# Patient Record
Sex: Female | Born: 1960
Health system: Southern US, Community
[De-identification: ages and names within clinical notes are randomized; demographics above are authoritative.]

## PROBLEM LIST (undated history)

## (undated) DIAGNOSIS — K5190 Ulcerative colitis, unspecified, without complications: Secondary | ICD-10-CM

## (undated) DIAGNOSIS — E559 Vitamin D deficiency, unspecified: Secondary | ICD-10-CM

## (undated) DIAGNOSIS — E785 Hyperlipidemia, unspecified: Secondary | ICD-10-CM

## (undated) HISTORY — DX: Hyperlipidemia, unspecified: E78.5

## (undated) HISTORY — DX: Vitamin D deficiency, unspecified: E55.9

## (undated) HISTORY — PX: FOOT SURGERY: SHX648

## (undated) HISTORY — PX: TONSILLECTOMY AND ADENOIDECTOMY: SUR1326

## (undated) HISTORY — PX: WISDOM TOOTH EXTRACTION: SHX21

## (undated) HISTORY — DX: Ulcerative colitis, unspecified, without complications: K51.90

---

## 1999-07-26 DIAGNOSIS — K5190 Ulcerative colitis, unspecified, without complications: Secondary | ICD-10-CM

## 1999-07-26 HISTORY — DX: Ulcerative colitis, unspecified, without complications: K51.90

## 1999-07-26 HISTORY — PX: ABDOMINAL HYSTERECTOMY: SHX81

## 2000-09-10 ENCOUNTER — Encounter: Payer: Self-pay | Admitting: Cardiology

## 2005-03-01 ENCOUNTER — Ambulatory Visit: Payer: Self-pay | Admitting: Family Medicine

## 2005-03-17 ENCOUNTER — Ambulatory Visit: Payer: Self-pay | Admitting: Sports Medicine

## 2005-03-25 ENCOUNTER — Ambulatory Visit: Payer: Self-pay | Admitting: Sports Medicine

## 2005-04-14 ENCOUNTER — Ambulatory Visit: Payer: Self-pay | Admitting: Family Medicine

## 2005-05-17 ENCOUNTER — Ambulatory Visit: Payer: Self-pay | Admitting: Psychology

## 2005-08-11 ENCOUNTER — Ambulatory Visit: Payer: Self-pay | Admitting: Psychology

## 2005-08-18 ENCOUNTER — Ambulatory Visit: Payer: Self-pay | Admitting: Psychology

## 2005-09-01 ENCOUNTER — Ambulatory Visit: Payer: Self-pay | Admitting: Family Medicine

## 2005-09-15 ENCOUNTER — Ambulatory Visit: Payer: Self-pay | Admitting: Family Medicine

## 2005-10-06 ENCOUNTER — Ambulatory Visit: Payer: Self-pay | Admitting: Family Medicine

## 2005-11-02 ENCOUNTER — Ambulatory Visit: Payer: Self-pay | Admitting: Psychology

## 2005-11-24 ENCOUNTER — Ambulatory Visit: Payer: Self-pay | Admitting: Psychology

## 2005-12-15 ENCOUNTER — Ambulatory Visit: Payer: Self-pay | Admitting: Psychology

## 2006-01-05 ENCOUNTER — Ambulatory Visit: Payer: Self-pay | Admitting: Psychology

## 2006-02-02 ENCOUNTER — Ambulatory Visit: Payer: Self-pay | Admitting: Psychology

## 2006-03-02 ENCOUNTER — Ambulatory Visit: Payer: Self-pay | Admitting: Family Medicine

## 2006-03-23 ENCOUNTER — Ambulatory Visit: Payer: Self-pay | Admitting: Family Medicine

## 2006-05-09 ENCOUNTER — Ambulatory Visit: Payer: Self-pay | Admitting: Family Medicine

## 2006-06-01 ENCOUNTER — Ambulatory Visit: Payer: Self-pay | Admitting: Psychology

## 2006-06-29 ENCOUNTER — Ambulatory Visit: Payer: Self-pay | Admitting: Family Medicine

## 2006-08-15 ENCOUNTER — Ambulatory Visit: Payer: Self-pay | Admitting: Psychology

## 2007-01-11 ENCOUNTER — Encounter: Admission: RE | Admit: 2007-01-11 | Discharge: 2007-01-11 | Payer: Self-pay | Admitting: Family Medicine

## 2008-01-14 ENCOUNTER — Encounter: Admission: RE | Admit: 2008-01-14 | Discharge: 2008-01-14 | Payer: Self-pay | Admitting: Family Medicine

## 2008-11-20 ENCOUNTER — Ambulatory Visit: Payer: Self-pay | Admitting: Family Medicine

## 2008-11-20 DIAGNOSIS — F4321 Adjustment disorder with depressed mood: Secondary | ICD-10-CM | POA: Insufficient documentation

## 2008-12-01 ENCOUNTER — Ambulatory Visit: Payer: Self-pay | Admitting: Family Medicine

## 2009-01-08 ENCOUNTER — Ambulatory Visit: Payer: Self-pay | Admitting: Psychology

## 2009-01-14 ENCOUNTER — Encounter: Admission: RE | Admit: 2009-01-14 | Discharge: 2009-01-14 | Payer: Self-pay | Admitting: Family Medicine

## 2009-01-22 ENCOUNTER — Ambulatory Visit: Payer: Self-pay | Admitting: Family Medicine

## 2009-02-18 ENCOUNTER — Encounter: Payer: Self-pay | Admitting: Cardiology

## 2009-02-19 ENCOUNTER — Ambulatory Visit: Payer: Self-pay | Admitting: Psychology

## 2009-03-24 ENCOUNTER — Telehealth: Payer: Self-pay | Admitting: Psychology

## 2009-04-06 ENCOUNTER — Ambulatory Visit: Payer: Self-pay | Admitting: Cardiology

## 2009-04-06 DIAGNOSIS — K5190 Ulcerative colitis, unspecified, without complications: Secondary | ICD-10-CM | POA: Insufficient documentation

## 2009-04-06 DIAGNOSIS — I059 Rheumatic mitral valve disease, unspecified: Secondary | ICD-10-CM | POA: Insufficient documentation

## 2009-04-06 DIAGNOSIS — R072 Precordial pain: Secondary | ICD-10-CM | POA: Insufficient documentation

## 2009-04-06 DIAGNOSIS — E7800 Pure hypercholesterolemia, unspecified: Secondary | ICD-10-CM | POA: Insufficient documentation

## 2009-04-09 ENCOUNTER — Ambulatory Visit: Payer: Self-pay | Admitting: Psychology

## 2009-06-04 ENCOUNTER — Ambulatory Visit: Payer: Self-pay | Admitting: Psychology

## 2009-06-11 ENCOUNTER — Ambulatory Visit: Payer: Self-pay | Admitting: Psychology

## 2009-08-04 ENCOUNTER — Ambulatory Visit: Payer: Self-pay | Admitting: Psychology

## 2009-08-14 DIAGNOSIS — F3289 Other specified depressive episodes: Secondary | ICD-10-CM | POA: Insufficient documentation

## 2009-08-14 DIAGNOSIS — N809 Endometriosis, unspecified: Secondary | ICD-10-CM | POA: Insufficient documentation

## 2009-08-14 DIAGNOSIS — E785 Hyperlipidemia, unspecified: Secondary | ICD-10-CM | POA: Insufficient documentation

## 2009-08-14 DIAGNOSIS — F329 Major depressive disorder, single episode, unspecified: Secondary | ICD-10-CM | POA: Insufficient documentation

## 2009-08-17 ENCOUNTER — Ambulatory Visit: Payer: Self-pay | Admitting: Cardiology

## 2009-08-27 ENCOUNTER — Ambulatory Visit: Payer: Self-pay | Admitting: Psychology

## 2009-09-17 ENCOUNTER — Telehealth: Payer: Self-pay | Admitting: Psychology

## 2009-09-18 ENCOUNTER — Ambulatory Visit: Payer: Self-pay

## 2009-09-18 ENCOUNTER — Ambulatory Visit: Payer: Self-pay | Admitting: Cardiology

## 2009-10-05 ENCOUNTER — Ambulatory Visit: Payer: Self-pay | Admitting: Psychology

## 2009-10-12 ENCOUNTER — Ambulatory Visit: Payer: Self-pay | Admitting: Psychology

## 2009-11-05 ENCOUNTER — Ambulatory Visit: Payer: Self-pay | Admitting: Psychology

## 2009-11-23 ENCOUNTER — Ambulatory Visit: Payer: Self-pay | Admitting: Psychology

## 2009-12-31 ENCOUNTER — Ambulatory Visit: Payer: Self-pay | Admitting: Psychology

## 2010-01-29 ENCOUNTER — Ambulatory Visit: Payer: Self-pay | Admitting: Psychology

## 2010-03-18 ENCOUNTER — Encounter: Admission: RE | Admit: 2010-03-18 | Discharge: 2010-03-18 | Payer: Self-pay | Admitting: Family Medicine

## 2010-03-23 ENCOUNTER — Ambulatory Visit (HOSPITAL_BASED_OUTPATIENT_CLINIC_OR_DEPARTMENT_OTHER): Admission: RE | Admit: 2010-03-23 | Discharge: 2010-03-23 | Payer: Self-pay | Admitting: Family Medicine

## 2010-03-23 ENCOUNTER — Ambulatory Visit: Payer: Self-pay | Admitting: Diagnostic Radiology

## 2010-08-15 ENCOUNTER — Encounter: Payer: Self-pay | Admitting: Family Medicine

## 2010-08-24 NOTE — Assessment & Plan Note (Signed)
Summary: Behavioral Medicine Follow-up   Primary Care Provider:  Spero Geralds PsyD   History of Present Illness: Shalonda reports feeling much better.  She left a VM the day after our last meeting and reported a new outlook on the situation.  She reiterated that today.  She reports a better understanding of the dynamics and how they are affecting her.  She continues to be in touch with [name] - nearly daily but does not plan to seem him.  She is not ready to let go of phone contact.   Impression & Recommendations:  Problem # 1:  ADJUSTMENT DISORDER WITH DEPRESSED MOOD (ICD-309.0)  Affect was bright until I challenged her on a number of issues.  She got sad over the course of the session but also appeared very reflective.  She recognizes therapy does not often "feel good" and that this may be part of the process.  Provided additional feedback about her tendency to deny how serious this is to her health and well-being.  Also validated that she needs to operate according to her own time frame.  Problem-solved ways to attempt to stay healthy in the face of this on-going stress.  Follow-up in three weeks or as needed.  Orders: Therapy 40-50- min- [facility] ([id number])  Complete Medication List: 1)  Aleve 220 Mg Tabs (Naproxen sodium) .... As needed

## 2010-08-24 NOTE — Assessment & Plan Note (Signed)
Summary: Behavioral Medicine Follow-up   Primary Care Provider:  Spero Geralds PsyD   History of Present Illness: Tamara Richardson is feeling sad and angry about the relationship with [name].  She had fallen back into it again only to be reminded of the reality of the situaiton.  Discussed this at length.   Impression & Recommendations:  Problem # 1:  ADJUSTMENT DISORDER WITH DEPRESSED MOOD (ICD-309.0) Affect is sad.  Report of  mood is same.  She is tearful.  Highlighted ambivalence while also framing the sickness (pathology) of this man and how it has made her so unwell.  This seemed to resonate a little more.  This isn't just about a bad match - this is about pathology that was his but has become hers.  She still wants to "save" him but seems to be recognizing more that this is her pathology.    Used importance and confidence scales.  Alarming that she believes it is important to get him out of her life for the short term but not necessarily the long-term.  Reflected back how hard it is to let go.  Confidence is not too low but she has been unsuccessful in the past.  Talked about what would need to happen to be successful.   She agreed to meet sooner than our typical so we scheduled for:  Monday 3/21st at 4:00.   Orders: Therapy 40-50- min- [facility] ([id number])  Complete Medication List: 1)  Aleve 220 Mg Tabs (Naproxen sodium) .... As needed
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
## 2010-08-24 NOTE — Assessment & Plan Note (Signed)
Summary: rov/chest pain/jml   Primary Provider:  Spero Geralds PsyD   History of Present Illness: 50 yo female  who I saw in September of 2010 for chest pain. The patient has had intermittent chest pain since age 59 by her report. It is in the left upper chest with occasional radiation to the left breast. There is no associated nausea, vomiting, shortness of breath or diaphoresis. It is not pleuritic or positional no work is it related to food. It is not exertional.  Her last episode occurred in early January. She feels these may be stress related. Note she did not return for her previously ordered stress echocardiogram. Note she denies any dyspnea on exertion, orthopnea, PND, pedal edema, palpitations or syncope.  Current Medications (verified): 1)  Aleve 220 Mg Tabs (Naproxen Sodium) .... As Needed  Past History:  Past Medical History: Current Problems:  MITRAL VALVE PROLAPSE (ICD-424.0) HYPERCHOLESTEROLEMIA-PURE (ICD-272.0) COLITIS, ULCERATIVE (ICD-556.9) ADJUSTMENT DISORDER WITH DEPRESSED MOOD (ICD-309.0) ENDOMETRIOSIS (ICD-617.9) DEPRESSION (ICD-311) Fibroids Skin disorder Exercise induced asthma  Past Surgical History: Reviewed history from 04/06/2009 and no changes required. TAH and BSO Tonsillectomy D and C  Social History: Reviewed history from 04/06/2009 and no changes required. Full Time Married  Tobacco Use - No.  Alcohol Use - yes  Review of Systems       no fevers or chills, productive cough, hemoptysis, dysphasia, odynophagia, melena, hematochezia, dysuria, hematuria, rash, seizure activity, orthopnea, PND, pedal edema, claudication. Remaining systems are negative.   Vital Signs:  Patient profile:   50 year old female Height:      68 inches Weight:      190 pounds BMI:     28.99 Pulse rate:   84 / minute Resp:     12 per minute BP sitting:   106 / 74  (left arm)  Vitals Entered By: Kem Parkinson (August 17, 2009 4:06 PM)  Physical  Exam  General:  Well-developed well-nourished in no acute distress.  Skin is warm and dry.  HEENT is normal.  Neck is supple. No thyromegaly.  Chest is clear to auscultation with normal expansion.  Cardiovascular exam is regular rate and rhythm.  Abdominal exam nontender or distended. No masses palpated. Extremities show no edema. neuro grossly intact    EKG  Procedure date:  08/17/2009  Findings:      Sinus rhythm at a rate of 81. Axis normal. No ST changes.  Impression & Recommendations:  Problem # 1:  CHEST PAIN, PRECORDIAL (ICD-786.51) Symptoms atypical. Will schedule stress echocardiogram for risk stratification.  Problem # 2:  HYPERLIPIDEMIA (ICD-272.4) Continue statin. Lipids and liver monitored by primary care. The following medications were removed from the medication list:    Simvastatin 10 Mg Tabs (Simvastatin) .Marland Kitchen... Take one tablet by mouth daily at bedtime  Problem # 3:  COLITIS, ULCERATIVE (ICD-556.9)  Problem # 4:  MITRAL VALVE PROLAPSE (ICD-424.0)  Orders: Stress Echo (Stress Echo)  Problem # 5:  ADJUSTMENT DISORDER WITH DEPRESSED MOOD (ICD-309.0)  Patient Instructions: 1)  Your physician recommends that you schedule a follow-up appointment in: as needed pending test results 2)  Your physician has requested that you have a stress echocardiogram. For further information please visit [email or web address]  Please follow instruction sheet as given.
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
## 2010-08-24 NOTE — Assessment & Plan Note (Signed)
Summary: Behavioral Medicine Follow-up   Primary Care Provider:  Spero Geralds PsyD   History of Present Illness: Doing well overall.  Great cruise with her sister.  Trouble with [name].  Continues to extract from unhealthy relationship.  Looking to pursue a passion but doesn't know what.   Impression & Recommendations:  Problem # 1:  ADJUSTMENT DISORDER WITH DEPRESSED MOOD (ICD-309.0)  Affect wnl.  Mood is reported as euthymic.  Thoughts are clear and goal directed.  Less [name] talk today.  I hope this is a good sign.  Will follow in four weeks per her request.    Orders: Therapy 40-50- min- [facility] ([id number])  Complete Medication List: 1)  Aleve 220 Mg Tabs (Naproxen sodium) .... As needed

## 2010-08-24 NOTE — Assessment & Plan Note (Signed)
Summary: Behavioral Medicine Follow-up   Primary Care Provider:  Spero Geralds PsyD   History of Present Illness: Tamara Richardson reports that she continues to struggle.  "Nothing is easy."  Spent time talking about [name] (she is getting closer to cutting off contact), [name] and [name].  Things are going well with [facility].  She has some significant concerns about [name].   Impression & Recommendations:  Problem # 1:  ADJUSTMENT DISORDER WITH DEPRESSED MOOD (ICD-309.0)  Report of mood is low normal.  Affect is about the same.  Thoughts seem more negative than her baseline.  She has some loss of pleasure in normally pleasant activities.  Work and home function remain wnl.  She did get some chest pain again after not having any for five months.  She got it after a long conversation with [name].  She ties it to her anxiety / heart "ache."  She has had several cardiac work-ups.  Discussed options.  She feels stuck.  She is not interested in medication.  She cites the things that help keep her balanced are regular physical activity and seeing her son successful and happy.  Will pursue the idea of "happiness" more next visit.  She elected to schedule for one month.  Orders: Therapy 40-50- min- [facility] ([id number])  Complete Medication List: 1)  Aleve 220 Mg Tabs (Naproxen sodium) .... As needed

## 2010-08-24 NOTE — Letter (Signed)
Summary: [facility]  [facility] Office Note   Imported By: Roderic Ovens 08/21/2009 10:34:42  _____________________________________________________________________  External Attachment:    Type:   Image     Comment:   External Document

## 2010-08-24 NOTE — Progress Notes (Signed)
Summary: Cancel and reschedule beh med  Phone Note Call from Patient   Caller: Patient Call For: Spero Geralds PsyD Summary of Call: Derisha called to cancel her March 3rd at 3:00 appt.  We rescheduled for March 14th at 9:00 a.m. Initial call taken by: Spero Geralds PsyD,  September 17, 2009 2:09 PM

## 2010-08-24 NOTE — Assessment & Plan Note (Signed)
Summary: Behavioral Medicine   Primary Care Provider:  Spero Geralds PsyD   History of Present Illness: Continues half-in / half-out of relationship with [name].  Is working on moving away from it.  Getting great support from family and friends.  Cruise with sister at the end of this week.  Work function remains fine.  She is variable in her mood based on the situation with [name].  She feels more powerful.   Impression & Recommendations:  Problem # 1:  ADJUSTMENT DISORDER WITH DEPRESSED MOOD (ICD-309.0) Laid off any challenging of beliefs.  These are the same as sessions past.  She sees movement and I rolled with this.  She still is assigning him responsibility for ending the relationship.  I did challenge this.  It is very unhealthy and potentially unsafe.    Thoughts today are clear and goal-directed.  Change is hard.  Report of mood is within normal limits.  Affect is wnl.  No evidence of SI / HI.  Elected to follow in three weeks.    Orders: Therapy 40-50- min- [facility] ([id number])  Complete Medication List: 1)  Simvastatin 10 Mg Tabs (Simvastatin) .... Take one tablet by mouth daily at bedtime 2)  Aleve 220 Mg Tabs (Naproxen sodium) .... As needed

## 2010-09-01 ENCOUNTER — Encounter: Payer: Self-pay | Admitting: *Deleted

## 2010-10-14 ENCOUNTER — Ambulatory Visit (INDEPENDENT_AMBULATORY_CARE_PROVIDER_SITE_OTHER): Payer: Managed Care, Other (non HMO) | Admitting: Psychology

## 2010-10-14 DIAGNOSIS — F4321 Adjustment disorder with depressed mood: Secondary | ICD-10-CM

## 2010-10-14 NOTE — Assessment & Plan Note (Signed)
Positive symptoms of depression include more depressed mood, anhedonia, middle insomnia (2-3 am nearly every night), some decreased appetite and mild feelings of not being valued.  Still has energy.  No evidence of SI.  Work productivity has been very good and she is valued there (although this doesn't really count for her).  She wonders about medication.  Discussed the advantages and disadvantages.  She thinks she will wait to see if she remains depressed after April 3rd.  She typically starts feeling better after her mother's birthday.  Discussed the relationship with [facility].  I wonder if he is struggling with anger about his Dad.  Tamara Richardson doesn't think so.  Recommended a book to help manage her feelings and maybe do something helpful regarding Devon.  Will follow in one week to check progress.  Might focus on identifying pleasant activities on a weekly basis and not thinking catastrophically about her current mood issues.

## 2010-10-14 NOTE — Progress Notes (Signed)
Tamara Richardson reports she is having a tough time.  She wonders about seasonal effects on her mood and also notes that she grows sad because her Mom died in early 2022-10-09 and her Mom's birthday is April 3rd.  In addition to this, she has been having an especially hard time with [place].  Continues with support from girlfriends although missing the shared responsibility that a significant other might bring.  Feels alone in the world.

## 2010-10-22 ENCOUNTER — Ambulatory Visit (INDEPENDENT_AMBULATORY_CARE_PROVIDER_SITE_OTHER): Payer: Managed Care, Other (non HMO) | Admitting: Psychology

## 2010-10-22 DIAGNOSIS — F4321 Adjustment disorder with depressed mood: Secondary | ICD-10-CM

## 2010-10-22 NOTE — Assessment & Plan Note (Signed)
Report of mood is down.  Affect is consistent.  Function remains high (her list of things accomplished yesterday was notable).  She has energy and motivation to get things done.  Her sleep is poor and this is a long standing problem.  With regards to [place], she has been parenting him without support from [employer] for some time.  She recognizes her current approach is not helping but is not sure what she could / should change.  She does "throw [name] under the bus" at times.  Also tends to try to motivate [place] in ways that are probably not useful.  Discussed.  Reiterated the book recommendation which I think will be helpful.  She says [name] would not be interested in therapy.  Other intervention - gratitude diary for one week to see if this has an effect on mood.  Will follow after Easter.  She is at the beach until the day before the appt with [name] and good friends.

## 2010-10-22 NOTE — Progress Notes (Signed)
Tamara Richardson continues to struggle with feeling badly about her life.  She thinks that no part of it is going well. Despite her success (and respect) at work, it is stressful and people don't listen to her when she asks for help.  She identifies the situation with [facility] as being the toughest thing to manage.  We spent the bulk of the time talking about that.  She is not interesting in taking a medication at present.

## 2010-11-04 ENCOUNTER — Ambulatory Visit (INDEPENDENT_AMBULATORY_CARE_PROVIDER_SITE_OTHER): Payer: Managed Care, Other (non HMO) | Admitting: Psychology

## 2010-11-04 DIAGNOSIS — F3289 Other specified depressive episodes: Secondary | ICD-10-CM

## 2010-11-04 DIAGNOSIS — F329 Major depressive disorder, single episode, unspecified: Secondary | ICD-10-CM

## 2010-11-04 NOTE — Patient Instructions (Signed)
Please schedule an appt for April 19th at 11:00. Nicole Cella, Ph.D. At [facility] Not forgiving someone is like drinking rat poison and expecting the rat to die. Feelings are not facts.  Keep that in mind as you wrestle with how you are feeling.

## 2010-11-05 NOTE — Progress Notes (Signed)
Oddie reports things have gotten worse - not better.  She had an especially bad time on vacation where things deteriorated further with [place].  In addition, she felt used by her friends and not well supported.  She does not remember a time where no aspect of her life seemed to be in a decent place.  She has frequently been buoyed by at least one positive thing.  She has a great deal of anger and resentment with [name] that can be directed toward [place].  In addition, she has good reason to be angry with [facility] as well.  Of course, feeling hurt is the biggest issue.

## 2010-11-05 NOTE — Assessment & Plan Note (Signed)
Report of mood is depressed.  Affect is consistent.  This is the most sad I have seen her.  Symptoms have likely gotten to the level where depression is a more accurate diagnosis than an "adjustment issue."  Her thinking is very negative and creative problem solving is not possible.  She descirbed herself as a "hopeless case."  Discuss this and its potential relationship to self-harm.  She denies.  No personal or family history.  Major barrier is her son.  Biggest issue remains with [name] who is proving increasingly challenging.  She does not have support from anyone around parenting.  Endeavored to shift the focus onto her while being careful of not suggesting blame.  The message:  Attempts to control Devon have not gone well.  She may feel better to focus on herself, not attempt to change his behavior (for a while) and regain a sense of balance.  Having said that - therapy for the both of them is an important step.  She dreads the battle and she might not have the energy to frame it well right now.  Scheduled back in one week.   See patient instructions for further interventions.

## 2010-11-11 ENCOUNTER — Ambulatory Visit (INDEPENDENT_AMBULATORY_CARE_PROVIDER_SITE_OTHER): Payer: Managed Care, Other (non HMO) | Admitting: Psychology

## 2010-11-11 DIAGNOSIS — F3289 Other specified depressive episodes: Secondary | ICD-10-CM

## 2010-11-11 DIAGNOSIS — F329 Major depressive disorder, single episode, unspecified: Secondary | ICD-10-CM

## 2010-11-11 NOTE — Assessment & Plan Note (Signed)
Report of mood is "better."  She is less down and less reactive.  Her affect today is brighter and her thoughts are more positive.  Her insight seems improved with regards to why she behaves with [name] the way she does.  Specifically talked about [name] and how to manage this with [facility].  Also talked about changing course when heading toward battle and using "I wish..." as a way to move around challenging situations she can't control.  Will follow on May 3 at 11:00.

## 2010-11-11 NOTE — Progress Notes (Signed)
Tamara Richardson reported that she is in a better place today.  After our last meeting, she started to feel a bit calmer and was able to take a more neutral stance with [place].  She notes continued conflict with some "blow-ups" but overall, she is feeling better.  She has a therapy appt scheduled this afternoon with [place].  He does not know because he reacted so negatively when she asked him about it.  Discussed this.  Also discussed specific strategies to employ that might be helpful.

## 2010-11-25 ENCOUNTER — Ambulatory Visit (INDEPENDENT_AMBULATORY_CARE_PROVIDER_SITE_OTHER): Payer: Managed Care, Other (non HMO) | Admitting: Psychology

## 2010-11-25 DIAGNOSIS — F3289 Other specified depressive episodes: Secondary | ICD-10-CM

## 2010-11-25 DIAGNOSIS — F329 Major depressive disorder, single episode, unspecified: Secondary | ICD-10-CM

## 2010-11-25 NOTE — Progress Notes (Signed)
Kaoru reports she is continues to do better than a few appts ago but is still having a tough time.  She has been to three therapy sessions with her son and that is going (reasonably) well.  Through some reading she seems to be gaining some insights about her style of parenting.  Discussed briefly her relationship with [name] and then focused on [place].  She has a great deal of anger with [name] and it is often transferred to [place].  Looked at ways to manage.  With regards to [name], the relationship was rekindled a few times.  Cali knows it is an unhealthy relationship and has some ideas about what keeps her hooked in.  She says she is closer to being done than she has been in a while.
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
## 2010-12-09 ENCOUNTER — Ambulatory Visit (INDEPENDENT_AMBULATORY_CARE_PROVIDER_SITE_OTHER): Payer: Managed Care, Other (non HMO) | Admitting: Psychology

## 2010-12-09 DIAGNOSIS — F3289 Other specified depressive episodes: Secondary | ICD-10-CM

## 2010-12-09 DIAGNOSIS — F329 Major depressive disorder, single episode, unspecified: Secondary | ICD-10-CM

## 2010-12-09 NOTE — Assessment & Plan Note (Signed)
Symptoms have largely remitted.  Giving the timing - it makes me wonder about the spring connection with Mother's Day, her mother's death etc..Marland KitchenWill be mindful of this for the future.  Work - life balance is poor right now and will likely take a toll eventually.  Discussed the idea that she is running close to empty and has no real means to fill up [facility] no longer does that for her and neither does work).  She named a couple of things that might be helpful including the trips she has planned and the time away from [place].  In addition, she is hopeful that things will shift when she closes this deal at work.  Until then, she plans to "hold on" and reduce her expectations regarding her life satisfaction.  Agreed to meet again in about a month in an attempt to provide support.  She can call in between.

## 2010-12-09 NOTE — Progress Notes (Signed)
Tamara Richardson reports that she thinks her depression has lifted.  She continues with significant stress at work and at home.  She has worked late almost every night (until 8:30) and is getting burned out with this.  Attempts to get help at work have not been fruitful.  She continues in therapy with her son.  The situation is not much better and she is drained by him.  Dr. Ave Filter has made the suggestion to not "fix" or "rescue" him when he fails to follow through.  This is a really tough sell for [employer]  Tamara Richardson has a busy summer with a trip to [place] and a few trips to the beach planned.  Tamara Richardson will be gone for two weeks at various camps as well.

## 2011-01-17 ENCOUNTER — Ambulatory Visit (INDEPENDENT_AMBULATORY_CARE_PROVIDER_SITE_OTHER): Payer: Managed Care, Other (non HMO) | Admitting: Psychology

## 2011-01-17 DIAGNOSIS — F329 Major depressive disorder, single episode, unspecified: Secondary | ICD-10-CM

## 2011-01-17 NOTE — Assessment & Plan Note (Addendum)
Report of mood is within normal limits but low for her.  Affect is within normal limits.  She looks well.  Thoughts are clear and goal directed.  Hit the issue with [name] pretty hard - attempting to understand what drives her to stay engaged with someone that is so unhealthy for her.  Discussed the beauty of behavior change - that doing it differently even at this point can be helpful.  Cognitively she understands however the behavior has always been thr tricky part with her.  Reminded her that just because she feels a certain way (bad mom) doesn't make it so.  Reviewed what has worked well and what typically doesn't.  Empathized with the single parent phenomenon - no one there to offer feedback or step in when her approach is less than helpful.    Overall, her mood is low end of normal.  She remains very functional but her cognitive and affective outlook is not as good as she would like.  She is still recovering from being significantly overworked.  Will monitor.

## 2011-01-17 NOTE — Progress Notes (Signed)
Tamara Richardson reports continued difficulty on all fronts although her work situation has calmed down.  Upon reflection - she has billed more hours the last five months than she did practically the whole year last year.  Understanding how out of balance her work / home life has been has shed some light on her feeling drained.  She mentioned that Tamara Richardson lost his job and she got "sucked back in."  Discussed this at length.  Reviewed the parenting situation with [place].  She feels like a bad mom and that she is no longer Tamara Richardson's #1 - or anyone's for that matter.  Had a "pitty party" on Saturday and felt better on Sunday.  Feels good about this (that she allowed herself to do this and rebounded after).

## 2011-02-03 ENCOUNTER — Ambulatory Visit (INDEPENDENT_AMBULATORY_CARE_PROVIDER_SITE_OTHER): Payer: Managed Care, Other (non HMO) | Admitting: Psychology

## 2011-02-03 DIAGNOSIS — F329 Major depressive disorder, single episode, unspecified: Secondary | ICD-10-CM

## 2011-02-04 NOTE — Progress Notes (Signed)
Tamara Richardson reports she got a major bonus from her work and a [employer], [employer] for her hard work.  This felt good.  Vacation to [place] went reasonable well.  [name] has been at away camps and [name] is enjoying the time to herself.  She has some other vacation scheduled.  She continues contact with [name].  Sees some of herself in him.  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

## 2011-02-04 NOTE — Assessment & Plan Note (Signed)
Report of mood is better.  Affect is consistent.  This is the best I have seen her in a while.  She has been leaving work around 3:00 several days and the positive feedback from her bosses seems to have validated her nicely.  The break from [place] likely eases the general sense of stress.  She says him being out of school means less arguments over that type of thing.  Looked at not using threats or ultimatums to motivate him as they are not likely to be effective.  Will follow as scheduled.

## 2011-03-08 ENCOUNTER — Other Ambulatory Visit: Payer: Self-pay | Admitting: Family Medicine

## 2011-03-08 DIAGNOSIS — Z1231 Encounter for screening mammogram for malignant neoplasm of breast: Secondary | ICD-10-CM

## 2011-03-24 ENCOUNTER — Ambulatory Visit (INDEPENDENT_AMBULATORY_CARE_PROVIDER_SITE_OTHER): Payer: Managed Care, Other (non HMO) | Admitting: Psychology

## 2011-03-24 DIAGNOSIS — F4321 Adjustment disorder with depressed mood: Secondary | ICD-10-CM

## 2011-03-24 NOTE — Assessment & Plan Note (Signed)
Mood issues that brought her in this time seem largely to have resolved.  Getting re-involved with [name] is a curious decision.  In her current state, she seems to be able to manage the situation well.  She recognizes that she still has that "spark" that could ignite a more intense relationship with him and that such a relationship is not a great idea.  She is positive in her thought content.  She looks well.  Function is good.  Outlook is good.  Discussed follow-up.  She will call as needed.
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
## 2011-03-25 ENCOUNTER — Ambulatory Visit
Admission: RE | Admit: 2011-03-25 | Discharge: 2011-03-25 | Disposition: A | Discharge status: Home / Self Care | Payer: Managed Care, Other (non HMO) | Source: Ambulatory Visit | Attending: Family Medicine | Admitting: Family Medicine

## 2011-03-25 DIAGNOSIS — Z1231 Encounter for screening mammogram for malignant neoplasm of breast: Secondary | ICD-10-CM

## 2012-01-12 ENCOUNTER — Ambulatory Visit (INDEPENDENT_AMBULATORY_CARE_PROVIDER_SITE_OTHER): Payer: Managed Care, Other (non HMO) | Admitting: Psychology

## 2012-01-12 DIAGNOSIS — F4321 Adjustment disorder with depressed mood: Secondary | ICD-10-CM
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
## 2012-04-20 ENCOUNTER — Other Ambulatory Visit: Payer: Self-pay | Admitting: Family Medicine

## 2012-04-20 DIAGNOSIS — Z1231 Encounter for screening mammogram for malignant neoplasm of breast: Secondary | ICD-10-CM

## 2012-05-07 ENCOUNTER — Ambulatory Visit
Admission: RE | Admit: 2012-05-07 | Discharge: 2012-05-07 | Disposition: A | Discharge status: Home / Self Care | Payer: Managed Care, Other (non HMO) | Source: Ambulatory Visit | Attending: Family Medicine | Admitting: Family Medicine

## 2012-05-07 DIAGNOSIS — Z1231 Encounter for screening mammogram for malignant neoplasm of breast: Secondary | ICD-10-CM

## 2013-03-05 ENCOUNTER — Other Ambulatory Visit: Payer: Self-pay | Admitting: Family Medicine

## 2013-03-05 DIAGNOSIS — R110 Nausea: Secondary | ICD-10-CM

## 2013-03-05 DIAGNOSIS — R109 Unspecified abdominal pain: Secondary | ICD-10-CM

## 2013-03-06 ENCOUNTER — Ambulatory Visit
Admission: RE | Admit: 2013-03-06 | Discharge: 2013-03-06 | Disposition: A | Discharge status: Home / Self Care | Payer: Managed Care, Other (non HMO) | Source: Ambulatory Visit | Attending: Family Medicine | Admitting: Family Medicine

## 2013-03-06 DIAGNOSIS — R109 Unspecified abdominal pain: Secondary | ICD-10-CM

## 2013-03-06 DIAGNOSIS — R110 Nausea: Secondary | ICD-10-CM

## 2013-04-26 ENCOUNTER — Other Ambulatory Visit: Payer: Self-pay

## 2013-04-26 DIAGNOSIS — Z1231 Encounter for screening mammogram for malignant neoplasm of breast: Secondary | ICD-10-CM

## 2013-05-08 ENCOUNTER — Ambulatory Visit: Payer: Managed Care, Other (non HMO)

## 2013-05-22 ENCOUNTER — Ambulatory Visit
Admission: RE | Admit: 2013-05-22 | Discharge: 2013-05-22 | Disposition: A | Discharge status: Home / Self Care | Payer: Private Health Insurance - Indemnity | Source: Ambulatory Visit

## 2013-05-22 DIAGNOSIS — Z1231 Encounter for screening mammogram for malignant neoplasm of breast: Secondary | ICD-10-CM

## 2013-05-24 ENCOUNTER — Ambulatory Visit: Payer: Self-pay

## 2014-05-12 ENCOUNTER — Other Ambulatory Visit: Payer: Self-pay

## 2014-05-12 DIAGNOSIS — Z1239 Encounter for other screening for malignant neoplasm of breast: Secondary | ICD-10-CM

## 2014-05-15 ENCOUNTER — Other Ambulatory Visit: Payer: Self-pay

## 2014-05-15 DIAGNOSIS — Z1231 Encounter for screening mammogram for malignant neoplasm of breast: Secondary | ICD-10-CM

## 2014-05-23 ENCOUNTER — Ambulatory Visit: Payer: Private Health Insurance - Indemnity

## 2014-06-09 DIAGNOSIS — E785 Hyperlipidemia, unspecified: Secondary | ICD-10-CM

## 2014-06-09 HISTORY — DX: Hyperlipidemia, unspecified: E78.5

## 2014-06-10 ENCOUNTER — Ambulatory Visit
Admission: RE | Admit: 2014-06-10 | Discharge: 2014-06-10 | Disposition: A | Discharge status: Home / Self Care | Payer: BC Managed Care – PPO | Source: Ambulatory Visit

## 2014-06-10 ENCOUNTER — Encounter (INDEPENDENT_AMBULATORY_CARE_PROVIDER_SITE_OTHER): Payer: Self-pay

## 2014-06-10 DIAGNOSIS — Z1231 Encounter for screening mammogram for malignant neoplasm of breast: Secondary | ICD-10-CM

## 2015-05-22 ENCOUNTER — Other Ambulatory Visit: Payer: Self-pay

## 2015-05-22 DIAGNOSIS — Z1231 Encounter for screening mammogram for malignant neoplasm of breast: Secondary | ICD-10-CM

## 2015-06-12 ENCOUNTER — Ambulatory Visit: Payer: Self-pay

## 2015-06-15 ENCOUNTER — Ambulatory Visit
Admission: RE | Admit: 2015-06-15 | Discharge: 2015-06-15 | Disposition: A | Discharge status: Home / Self Care | Payer: BLUE CROSS/BLUE SHIELD | Source: Ambulatory Visit

## 2015-06-15 DIAGNOSIS — Z1231 Encounter for screening mammogram for malignant neoplasm of breast: Secondary | ICD-10-CM

## 2016-06-03 ENCOUNTER — Other Ambulatory Visit: Payer: Self-pay | Admitting: Family Medicine

## 2016-06-03 DIAGNOSIS — Z1231 Encounter for screening mammogram for malignant neoplasm of breast: Secondary | ICD-10-CM

## 2016-06-15 ENCOUNTER — Ambulatory Visit: Payer: BLUE CROSS/BLUE SHIELD

## 2016-06-21 ENCOUNTER — Ambulatory Visit
Admission: RE | Admit: 2016-06-21 | Discharge: 2016-06-21 | Disposition: A | Discharge status: Home / Self Care | Payer: BLUE CROSS/BLUE SHIELD | Source: Ambulatory Visit | Attending: Family Medicine | Admitting: Family Medicine

## 2016-06-21 DIAGNOSIS — Z1231 Encounter for screening mammogram for malignant neoplasm of breast: Secondary | ICD-10-CM

## 2016-12-03 DIAGNOSIS — L258 Unspecified contact dermatitis due to other agents: Secondary | ICD-10-CM | POA: Diagnosis not present

## 2016-12-03 DIAGNOSIS — T7840XA Allergy, unspecified, initial encounter: Secondary | ICD-10-CM | POA: Diagnosis not present

## 2017-02-10 DIAGNOSIS — Z713 Dietary counseling and surveillance: Secondary | ICD-10-CM | POA: Diagnosis not present

## 2017-02-23 DIAGNOSIS — D2230 Melanocytic nevi of unspecified part of face: Secondary | ICD-10-CM | POA: Diagnosis not present

## 2017-02-23 DIAGNOSIS — L821 Other seborrheic keratosis: Secondary | ICD-10-CM | POA: Diagnosis not present

## 2017-02-28 DIAGNOSIS — Z23 Encounter for immunization: Secondary | ICD-10-CM | POA: Diagnosis not present

## 2017-02-28 DIAGNOSIS — Z0000 Encounter for general adult medical examination without abnormal findings: Secondary | ICD-10-CM | POA: Diagnosis not present

## 2017-04-19 DIAGNOSIS — Z713 Dietary counseling and surveillance: Secondary | ICD-10-CM | POA: Diagnosis not present

## 2017-04-21 DIAGNOSIS — Z23 Encounter for immunization: Secondary | ICD-10-CM | POA: Diagnosis not present

## 2017-05-03 DIAGNOSIS — Z713 Dietary counseling and surveillance: Secondary | ICD-10-CM | POA: Diagnosis not present

## 2017-05-17 DIAGNOSIS — Z713 Dietary counseling and surveillance: Secondary | ICD-10-CM | POA: Diagnosis not present

## 2017-05-22 ENCOUNTER — Other Ambulatory Visit: Payer: Self-pay | Admitting: Family Medicine

## 2017-05-22 DIAGNOSIS — Z1231 Encounter for screening mammogram for malignant neoplasm of breast: Secondary | ICD-10-CM

## 2017-05-31 DIAGNOSIS — Z713 Dietary counseling and surveillance: Secondary | ICD-10-CM | POA: Diagnosis not present

## 2017-06-07 DIAGNOSIS — Z713 Dietary counseling and surveillance: Secondary | ICD-10-CM | POA: Diagnosis not present

## 2017-06-28 ENCOUNTER — Ambulatory Visit
Admission: RE | Admit: 2017-06-28 | Discharge: 2017-06-28 | Disposition: A | Discharge status: Home / Self Care | Payer: BLUE CROSS/BLUE SHIELD | Source: Ambulatory Visit | Attending: Family Medicine | Admitting: Family Medicine

## 2017-06-28 DIAGNOSIS — Z1231 Encounter for screening mammogram for malignant neoplasm of breast: Secondary | ICD-10-CM

## 2017-10-16 DIAGNOSIS — L570 Actinic keratosis: Secondary | ICD-10-CM | POA: Diagnosis not present

## 2017-10-16 DIAGNOSIS — D1801 Hemangioma of skin and subcutaneous tissue: Secondary | ICD-10-CM | POA: Diagnosis not present

## 2017-10-16 DIAGNOSIS — L821 Other seborrheic keratosis: Secondary | ICD-10-CM | POA: Diagnosis not present

## 2017-10-16 DIAGNOSIS — L814 Other melanin hyperpigmentation: Secondary | ICD-10-CM | POA: Diagnosis not present

## 2017-10-16 DIAGNOSIS — D225 Melanocytic nevi of trunk: Secondary | ICD-10-CM | POA: Diagnosis not present

## 2018-04-09 ENCOUNTER — Encounter: Payer: Self-pay | Admitting: Physician Assistant

## 2018-04-09 ENCOUNTER — Ambulatory Visit (INDEPENDENT_AMBULATORY_CARE_PROVIDER_SITE_OTHER): Payer: BLUE CROSS/BLUE SHIELD | Admitting: Physician Assistant

## 2018-04-09 VITALS — BP 120/84 | HR 84 | Temp 98.4°F | Ht 67.5 in | Wt 204.0 lb

## 2018-04-09 DIAGNOSIS — Z7689 Persons encountering health services in other specified circumstances: Secondary | ICD-10-CM | POA: Diagnosis not present

## 2018-04-09 DIAGNOSIS — R42 Dizziness and giddiness: Secondary | ICD-10-CM | POA: Diagnosis not present

## 2018-04-09 DIAGNOSIS — G44209 Tension-type headache, unspecified, not intractable: Secondary | ICD-10-CM

## 2018-04-09 MED ORDER — BUTALBITAL-APAP-CAFFEINE 50-325-40 MG PO TABS: 1.0000 | ORAL_TABLET | Freq: Four times a day (QID) | ORAL | 0 refills | Status: AC | PRN

## 2018-04-09 NOTE — Patient Instructions (Addendum)
It was great to see you!  Let's follow-up in 1 month, sooner if you have concerns.  Take care,  Inda Coke PA-C   Tension Headache A tension headache is a feeling of pain, pressure, or aching that is often felt over the front and sides of the head. The pain can be dull, or it can feel tight (constricting). Tension headaches are not normally associated with nausea or vomiting, and they do not get worse with physical activity. Tension headaches can last from 30 minutes to several days. This is the most common type of headache. CAUSES The exact cause of this condition is not known. Tension headaches often begin after stress, anxiety, or depression. Other triggers may include:  Alcohol.  Too much caffeine, or caffeine withdrawal.  Respiratory infections, such as colds, flu, or sinus infections.  Dental problems or teeth clenching.  Fatigue.  Holding your head and neck in the same position for a long period of time, such as while using a computer.  Smoking. SYMPTOMS Symptoms of this condition include:  A feeling of pressure around the head.  Dull, aching head pain.  Pain felt over the front and sides of the head.  Tenderness in the muscles of the head, neck, and shoulders. DIAGNOSIS This condition may be diagnosed based on your symptoms and a physical exam. Tests may be done, such as a CT scan or an MRI of your head. These tests may be done if your symptoms are severe or unusual. TREATMENT This condition may be treated with lifestyle changes and medicines to help relieve symptoms. HOME CARE INSTRUCTIONS Managing Pain  Take over-the-counter and prescription medicines only as told by your health care provider.  Lie down in a dark, quiet room when you have a headache.  If directed, apply ice to the head and neck area: ? Put ice in a plastic bag. ? Place a towel between your skin and the bag. ? Leave the ice on for 20 minutes, 2-3 times per day.  Use a heating pad or  a hot shower to apply heat to the head and neck area as told by your health care provider. Eating and Drinking  Eat meals on a regular schedule.  Limit alcohol use.  Decrease your caffeine intake, or stop using caffeine. General Instructions  Keep all follow-up visits as told by your health care provider. This is important.  Keep a headache journal to help find out what may trigger your headaches. For example, write down: ? What you eat and drink. ? How much sleep you get. ? Any change to your diet or medicines.  Try massage or other relaxation techniques.  Limit stress.  Sit up straight, and avoid tensing your muscles.  Do not use tobacco products, including cigarettes, chewing tobacco, or e-cigarettes. If you need help quitting, ask your health care provider.  Exercise regularly as told by your health care provider.  Get 7-9 hours of sleep, or the amount recommended by your health care provider. SEEK MEDICAL CARE IF:  Your symptoms are not helped by medicine.  You have a headache that is different from what you normally experience.  You have nausea or you vomit.  You have a fever. SEEK IMMEDIATE MEDICAL CARE IF:  Your headache becomes severe.  You have repeated vomiting.  You have a stiff neck.  You have a loss of vision.  You have problems with speech.  You have pain in your eye or ear.  You have muscular weakness or loss of  muscle control.  You lose your balance or you have trouble walking.  You feel faint or you pass out.  You have confusion. This information is not intended to replace advice given to you by your health care provider. Make sure you discuss any questions you have with your health care provider. Document Released: 07/11/2005 Document Revised: 04/01/2015 Document Reviewed: 11/03/2014 Elsevier Interactive Patient Education  2017 [employer].

## 2018-04-09 NOTE — Progress Notes (Signed)
Tamara Richardson is a 57 y.o. female here to Establish Care.  I acted as a Education administrator for [employer], PA-C Anselmo Pickler, LPN  History of Present Illness:   Chief Complaint  Patient presents with  . Establish Care  . Headache    x 2-3 week off and on    Headaches -- having significant stress in her life presently -- son has issues with depression and anxiety and is at [employer]. Significant job stress. 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 Recently took an allergy pill, but only took once. She has felt some lightheadedness with her HA's, "not feeling exactly right", but denies: vision changes, weakness on one-side of the body, severe HA pain, changes in balance, confusion. She does not sleep well at baseline, has issues with waking up around 3-4 am and is unable to go to sleep. Doesn't drink sodas or coffee, does drink 3-4 cups of caffeinated tea daily. For her HA has been taking 1-2 Aleve capsules daily. Also states that while she was having sex the pain was a little worse. Also states that she is still able to exercise (cardio) without any exacerbation of symptoms. Feels like a "band" of pressure around her head.  Health Maintenance: Immunizations -- UTD Colonoscopy -- requesting records Mammogram -- requesting records Weight -- Weight: 204 lb (92.5 kg)   Depression screen [facility] 2/9 04/09/2018  Decreased Interest 0  Down, Depressed, Hopeless 0  PHQ - 2 Score 0    No flowsheet data found.  Other providers/specialists: Dr. Alice Reichert -- GI    Past Medical History:  Diagnosis Date  . Hyperlipidemia 06/09/2014   genetic, was on statin for about 6 months  . Ulcerative colitis ([place]) 2001   mostly controlled with diet  . Vitamin D deficiency      Social History   Socioeconomic History  . Marital status: Married    Spouse name: Not  on file  . Number of children: Not on file  . Years of education: Not on file  . Highest education level: Not on file  Occupational History  . Not on file  Social Needs  . Financial resource strain: Not on file  . Food insecurity:    Worry: Not on file    Inability: Not on file  . Transportation needs:    Medical: Not on file    Non-medical: Not on file  Tobacco Use  . Smoking status: Never Smoker  . Smokeless tobacco: Never Used  Substance and Sexual Activity  . Alcohol use: Yes    Alcohol/week: 1.0 standard drinks    Types: 1 Glasses of wine per week  . Drug use: Never  . Sexual activity: Yes    Birth control/protection: Surgical  Lifestyle  . Physical activity:    Days per week: Not on file    Minutes per session: Not on file  . Stress: Not on file  Relationships  . Social connections:    Talks on phone: Not on file    Gets together: Not on file    Attends religious service: Not on file    Active member of club or organization: Not on file    Attends meetings of clubs or organizations: Not on file    Relationship status: Not on file  . Intimate partner violence:    Fear of current  or ex partner: Not on file    Emotionally abused: Not on file    Physically abused: Not on file    Forced sexual activity: Not on file  Other Topics Concern  . 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 ABDOMINAL HYSTERECTOMY  2001  . FOOT SURGERY Bilateral   . TONSILLECTOMY AND ADENOIDECTOMY    . WISDOM TOOTH EXTRACTION      Family History  Problem Relation Age of Onset  . Heart disease Mother   . Heart attack Father   . Hypertension Father   . Breast cancer Maternal Aunt 68       died at age 71  . Colon cancer Neg Hx     Allergies  Allergen Reactions  . Codeine   . Uncaria Tomentosa (Cats Claw)     Severe allergy to cats     Current Medications:   Current  Outpatient Medications:  .  butalbital-acetaminophen-caffeine (FIORICET, ESGIC) 50-325-40 MG tablet, Take 1-2 tablets by mouth every 6 (six) hours as needed for headache., Disp: 20 tablet, Rfl: 0 .  naproxen sodium (ALEVE) 220 MG tablet, Take by mouth as needed.  , Disp: , Rfl:    Review of Systems:   ROS  Negative unless otherwise specified per HPI.  Vitals:   Vitals:   04/09/18 1001  BP: 120/84  Pulse: 84  Temp: 98.4 F (36.9 C)  TempSrc: Oral  SpO2: 97%  Weight: 204 lb (92.5 kg)  Height: 5' 7.5" (1.715 m)     Body mass index is 31.48 kg/m.  Physical Exam:   Physical Exam  Constitutional: She appears well-developed. She is cooperative.  Non-toxic appearance. She does not have a sickly appearance. She does not appear ill. No distress.  HENT:  Head: Normocephalic and atraumatic.  Right Ear: Tympanic membrane, external ear and ear canal normal. Tympanic membrane is not erythematous, not retracted and not bulging.  Left Ear: Tympanic membrane, external ear and ear canal normal. Tympanic membrane is not erythematous, not retracted and not bulging.  Nose: Nose normal. Right sinus exhibits no maxillary sinus tenderness and no frontal sinus tenderness. Left sinus exhibits no maxillary sinus tenderness and no frontal sinus tenderness.  Mouth/Throat: Uvula is midline. No posterior oropharyngeal edema or posterior oropharyngeal erythema.  Eyes: Conjunctivae and lids are normal.  Neck: Trachea normal.  Cardiovascular: Normal rate, regular rhythm, S1 normal, S2 normal and normal heart sounds.  Pulmonary/Chest: Effort normal and breath sounds normal. She has no decreased breath sounds. She has no wheezes. She has no rhonchi. She has no rales.  Lymphadenopathy:    She has no cervical adenopathy.  Neurological: She is alert. She has normal strength. No cranial nerve deficit or sensory deficit. Coordination and gait normal. GCS eye subscore is 4. GCS verbal subscore is 5. GCS motor subscore  is 6.  Skin: Skin is warm, dry and intact.  Psychiatric: She has a normal mood and affect. Her speech is normal and behavior is normal.  Nursing note and vitals reviewed.  EKG tracing is personally reviewed.  EKG notes NSR.  No acute changes.   Assessment and Plan:    Pasty was seen today for establish care and headache.  Diagnoses and all orders for this visit:  Encounter to establish care  Lightheadedness and Tension headache Discussed briefly with Dr. Juleen China. No red flags on exam --  neuro exam benign. Follow-up in 1-2 weeks for physical and we will follow-up on it at that time. I recommended that she start an allergy pill in case she is developing sinus congestion. May try Fioricet to see if this helps with her symptoms. Follow-up if symptoms worsen or persist in the meantime.  Other orders -     butalbital-acetaminophen-caffeine (FIORICET, ESGIC) 50-325-40 MG tablet; Take 1-2 tablets by mouth every 6 (six) hours as needed for headache.    . Reviewed expectations re: course of current medical issues. . Discussed self-management of symptoms. . Outlined signs and symptoms indicating need for more acute intervention. . Patient verbalized understanding and all questions were answered. . See orders for this visit as documented in the electronic medical record. . Patient received an After-Visit Summary.   CMA or LPN served as scribe during this visit. History, Physical, and Plan performed by medical provider. The above documentation has been reviewed and is accurate and complete.  Inda Coke, PA-C

## 2018-04-16 ENCOUNTER — Encounter: Payer: Self-pay | Admitting: Physician Assistant

## 2018-04-16 ENCOUNTER — Ambulatory Visit (INDEPENDENT_AMBULATORY_CARE_PROVIDER_SITE_OTHER): Payer: BLUE CROSS/BLUE SHIELD | Admitting: Physician Assistant

## 2018-04-16 VITALS — BP 110/70 | HR 74 | Temp 98.1°F | Ht 67.5 in | Wt 203.0 lb

## 2018-04-16 DIAGNOSIS — K5190 Ulcerative colitis, unspecified, without complications: Secondary | ICD-10-CM

## 2018-04-16 DIAGNOSIS — Z1159 Encounter for screening for other viral diseases: Secondary | ICD-10-CM

## 2018-04-16 DIAGNOSIS — Z0001 Encounter for general adult medical examination with abnormal findings: Secondary | ICD-10-CM

## 2018-04-16 DIAGNOSIS — E7800 Pure hypercholesterolemia, unspecified: Secondary | ICD-10-CM | POA: Diagnosis not present

## 2018-04-16 DIAGNOSIS — H6121 Impacted cerumen, right ear: Secondary | ICD-10-CM | POA: Diagnosis not present

## 2018-04-16 DIAGNOSIS — G44209 Tension-type headache, unspecified, not intractable: Secondary | ICD-10-CM

## 2018-04-16 DIAGNOSIS — Z114 Encounter for screening for human immunodeficiency virus [HIV]: Secondary | ICD-10-CM

## 2018-04-16 LAB — CBC WITH DIFFERENTIAL/PLATELET
BASOS PCT: 0.6 % (ref 0.0–3.0)
Basophils Absolute: 0 10*3/uL (ref 0.0–0.1)
EOS PCT: 2.8 % (ref 0.0–5.0)
Eosinophils Absolute: 0.1 10*3/uL (ref 0.0–0.7)
HEMATOCRIT: 41.1 % (ref 36.0–46.0)
Hemoglobin: 14 g/dL (ref 12.0–15.0)
LYMPHS ABS: 1.6 10*3/uL (ref 0.7–4.0)
Lymphocytes Relative: 36.1 % (ref 12.0–46.0)
MCHC: 34 g/dL (ref 30.0–36.0)
MCV: 92.2 fl (ref 78.0–100.0)
MONO ABS: 0.3 10*3/uL (ref 0.1–1.0)
Monocytes Relative: 7.4 % (ref 3.0–12.0)
Neutro Abs: 2.4 10*3/uL (ref 1.4–7.7)
Neutrophils Relative %: 53.1 % (ref 43.0–77.0)
PLATELETS: 264 10*3/uL (ref 150.0–400.0)
RBC: 4.46 Mil/uL (ref 3.87–5.11)
RDW: 12.9 % (ref 11.5–15.5)
WBC: 4.5 10*3/uL (ref 4.0–10.5)

## 2018-04-16 LAB — COMPREHENSIVE METABOLIC PANEL
ALBUMIN: 4.6 g/dL (ref 3.5–5.2)
ALT: 12 U/L (ref 0–35)
AST: 11 U/L (ref 0–37)
Alkaline Phosphatase: 58 U/L (ref 39–117)
BILIRUBIN TOTAL: 1.4 mg/dL — AB (ref 0.2–1.2)
BUN: 18 mg/dL (ref 6–23)
CALCIUM: 9.7 mg/dL (ref 8.4–10.5)
CO2: 30 meq/L (ref 19–32)
CREATININE: 0.93 mg/dL (ref 0.40–1.20)
Chloride: 104 mEq/L (ref 96–112)
GFR: 65.63 mL/min (ref >60.00)
Glucose, Bld: 96 mg/dL (ref 70–99)
Potassium: 4.3 mEq/L (ref 3.5–5.1)
Sodium: 142 mEq/L (ref 135–145)
Total Protein: 7.1 g/dL (ref 6.0–8.3)

## 2018-04-16 LAB — LIPID PANEL
CHOL/HDL RATIO: 5
Cholesterol: 215 mg/dL — ABNORMAL HIGH (ref 0–200)
HDL: 41.8 mg/dL (ref >39.00)
LDL Cholesterol: 141 mg/dL — ABNORMAL HIGH (ref 0–99)
NonHDL: 172.78
TRIGLYCERIDES: 157 mg/dL — AB (ref 0.0–149.0)
VLDL: 31.4 mg/dL (ref 0.0–40.0)

## 2018-04-16 NOTE — Patient Instructions (Signed)
It was great to see you! ? ?Please go to the lab for blood work.  ? ?Our office will call you with your results unless you have chosen to receive results via MyChart. ? ?If your blood work is normal we will follow-up each year for physicals and as scheduled for chronic medical problems. ? ?If anything is abnormal we will treat accordingly and get you in for a follow-up. ? ?Take care, ? ?Tamiyah Moulin ?  ? ? ?

## 2018-04-16 NOTE — Progress Notes (Addendum)
I acted as a Education administrator for [employer], PA-C [employer], LPN   Subjective:    Tamara Richardson is a 57 y.o. female and is here for a comprehensive physical exam.   HPI  Health Maintenance Due  Topic Date Due  . Hepatitis C Screening  [birth year]  . HIV Screening  06/[birth year]/1974  . COLONOSCOPY  06/25/2009    Acute Concerns: R ear cerumen impaction -- uses q-tips regularly  Chronic Issues: Ulcerative colitis -- currently well controlled, highly affected by stress and what she eats. Tension HA -- currently improved from last visit, thinks that they are definitely coordinated with stress HLD -- was on a statin for about 6 months, significant family history  Health Maintenance: Immunizations -- UTD, will get at work. Shingirx discussed. Colonoscopy -- ? Due now, pt thinks every 5 years. 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 Patient has had a hysterectomy. Alcohol use: 1 glass wine per week Tobacco use: never smoker  Depression screen [facility] 2/9 04/16/2018  Decreased Interest 0  Down, Depressed, Hopeless 0  PHQ - 2 Score 0  Altered sleeping 1  Tired, decreased energy 0  Change in appetite 0  Feeling bad or failure about yourself  0  Trouble concentrating 0  Moving slowly or fidgety/restless 0  Suicidal thoughts 0  PHQ-9 Score 1  Difficult doing work/chores Not difficult at all     Other providers/specialists: Ob-Gyn GI   PMHx, SurgHx, SocialHx, Medications, and Allergies were  reviewed in the Visit Navigator and updated as appropriate.   Past Medical History:  Diagnosis Date  . Hyperlipidemia 06/09/2014   genetic, was on statin for about 6 months  . Ulcerative colitis ([place]) 2001   mostly controlled with diet  . Vitamin D deficiency      Past Surgical History:  Procedure Laterality Date  . ABDOMINAL HYSTERECTOMY  2001  . FOOT SURGERY Bilateral   . TONSILLECTOMY AND ADENOIDECTOMY    . WISDOM TOOTH EXTRACTION       Family History  Problem Relation Age of Onset  . Heart disease Mother   . Heart attack Father   . Hypertension Father   . Breast cancer Maternal Aunt 46       died at age 33  . Colon cancer Neg Hx     Social History   Tobacco Use  . Smoking status: Never Smoker  . Smokeless tobacco: Never Used  Substance Use Topics  . Alcohol use: Yes    Alcohol/week: 1.0 standard drinks    Types: 1 Glasses of wine per week  . Drug use: Never    Review of Systems:   Review of Systems  Constitutional: Negative.  Negative for chills, fever, malaise/fatigue and weight loss.  HENT: Negative.  Negative for hearing loss, sinus pain and sore throat.   Eyes: Negative.  Negative for blurred vision.  Respiratory: Negative.  Negative for cough and shortness of breath.   Cardiovascular: Negative.  Negative  for chest pain, palpitations and leg swelling.  Gastrointestinal: Negative.  Negative for abdominal pain, constipation, diarrhea, heartburn, nausea and vomiting.  Genitourinary: Negative.  Negative for dysuria, frequency and urgency.  Musculoskeletal: Negative.  Negative for back pain, myalgias and neck pain.  Skin: Negative.  Negative for itching and rash.  Neurological: Negative.  Negative for dizziness, tingling, seizures, loss of consciousness and headaches.  Endo/Heme/Allergies: Negative.  Negative for polydipsia.  Psychiatric/Behavioral: Negative.  Negative for depression. The patient is not nervous/anxious.     Objective:   BP 110/70 (BP  Location: Left Arm, Patient Position: Sitting, Cuff Size: Large)   Pulse 74   Temp 98.1 F (36.7 C) (Oral)   Ht 5' 7.5" (1.715 m)   Wt 203 lb (92.1 kg)   SpO2 97%   BMI 31.33 kg/m  Body mass index is 31.33 kg/m.   General Appearance:    Alert, cooperative, no distress, appears stated age  Head:    Normocephalic, without obvious abnormality, atraumatic  Eyes:    PERRL, conjunctiva/corneas clear, EOM's intact, fundi    benign, both eyes  Ears:    R TM obstructed by dark, firm cerumen   Normal L TM's and external ear canals, both ears  Nose:   Nares normal, septum midline, mucosa normal, no drainage    or sinus tenderness  Throat:   Lips, mucosa, and tongue normal; teeth and gums normal  Neck:   Supple, symmetrical, trachea midline, no adenopathy;    thyroid:  no enlargement/tenderness/nodules; no carotid   bruit or JVD  Back:     Symmetric, no curvature, ROM normal, no CVA tenderness  Lungs:     Clear to auscultation bilaterally, respirations unlabored  Chest Wall:    No tenderness or deformity   Heart:    Regular rate and rhythm, S1 and S2 normal, no murmur, rub   or gallop  Breast Exam:   Deferred  Abdomen:     Soft, non-tender, bowel sounds active all four quadrants,    no masses, no organomegaly  Genitalia:    Deferred  Extremities:   Extremities normal, atraumatic, no cyanosis or edema  Pulses:   2+ and symmetric all extremities  Skin:   Skin color, texture, turgor normal, no rashes or lesions  Lymph nodes:   Cervical, supraclavicular, and axillary nodes normal  Neurologic:   CNII-XII intact, normal strength, sensation and reflexes    throughout   Procedure: Cerumen Disimpaction Warm water was applied and gentle ear lavage performed on the right. There were no complications and following the disimpaction the tympanic membrane was not visible on the right. Auditory canals are normal.  Assessment/Plan:   Tamara Richardson was seen today for annual exam.  Diagnoses and all orders  for this visit:  Encounter for general adult medical examination with abnormal findings Today patient counseled on age appropriate routine health concerns for screening and prevention, each reviewed and up to date or declined. Immunizations reviewed and up to date or declined. Labs ordered and reviewed. Risk factors for depression reviewed and negative. Hearing function and visual acuity are intact. ADLs screened and addressed as needed. Functional ability and level of safety reviewed and appropriate. Education, counseling and referrals performed based on assessed risks today. Patient provided with a copy of personalized plan for preventive services.  Ulcerative colitis without complications, unspecified location [facility]) Currently without issues. Sees GI prn. -     CBC with Differential/Platelet -     Comprehensive metabolic panel  HYPERCHOLESTEROLEMIA-PURE Re-check lipid  panel today. -     Lipid panel  Screening for HIV (human immunodeficiency virus) -     HIV Antibody (routine testing w rflx)  Encounter for screening for other viral diseases -     Hepatitis C antibody  Tension headache Currently well controlled, episodes have significantly decreased since I last saw her. Follow-up if symptoms worsen.  Obese Technically meets obesity criteria based on BMI however she is very muscular and do not suspect that she is truly "obese." Continue to work on diet.  Impacted cerumen of right ear Ear lavage attempted but unsuccessful. Discussed referral to ENT however she would like to try Debrox to see if this will help with her symptoms. If unsuccessful, she will contact the office so we can send to ENT.   Well Adult Exam: Labs ordered: Yes. Patient counseling was done. See below for items discussed. Discussed the patient's BMI.  The BMI BMI is not in the acceptable range; BMI management plan is completed Follow up as needed for acute illness. Breast cancer screening: UTD. Cervical cancer  screening: n/a   Patient Counseling: [x]    Nutrition: Stressed importance of moderation in sodium/caffeine intake, saturated fat and cholesterol, caloric balance, sufficient intake of fresh fruits, vegetables, fiber, calcium, iron, and 1 mg of folate supplement per day (for females capable of pregnancy).  [x]    Stressed the importance of regular exercise.   [x]    Substance Abuse: Discussed cessation/primary prevention of tobacco, alcohol, or other drug use; driving or other dangerous activities under the influence; availability of treatment for abuse.   [x]    Injury prevention: Discussed safety belts, safety helmets, smoke detector, smoking near bedding or upholstery.   [x]    Sexuality: Discussed sexually transmitted diseases, partner selection, use of condoms, avoidance of unintended pregnancy  and contraceptive alternatives.  [x]    Dental health: Discussed importance of regular tooth brushing, flossing, and dental visits.  [x]    Health maintenance and immunizations reviewed. Please refer to Health maintenance section.   CMA or LPN served as scribe during this visit. History, Physical, and Plan performed by medical provider. The above documentation has been reviewed and is accurate and complete.  Inda Coke, PA-C [place]

## 2018-04-17 LAB — HEPATITIS C ANTIBODY
Hepatitis C Ab: NONREACTIVE
SIGNAL TO CUT-OFF: 0.12 (ref <1.00)

## 2018-04-17 LAB — HIV ANTIBODY (ROUTINE TESTING W REFLEX): HIV: NONREACTIVE

## 2018-04-19 ENCOUNTER — Telehealth: Payer: Self-pay | Admitting: Family Medicine

## 2018-04-19 ENCOUNTER — Telehealth: Payer: Self-pay | Admitting: Physician Assistant

## 2018-04-19 DIAGNOSIS — H6121 Impacted cerumen, right ear: Secondary | ICD-10-CM

## 2018-04-19 NOTE — Addendum Note (Signed)
Addended by: Marian Sorrow on: 04/19/2018 10:53 AM   Modules accepted: Orders

## 2018-04-19 NOTE — Telephone Encounter (Signed)
Spoke to pt, told her sorry to hear about her ear. Discussed with Aldona Bar okay to put referral in for ENT. Told pt someone will contact you to schedule an appointment. Pt verbalized understanding. Order for referral to ENT put in [place].

## 2018-04-19 NOTE — Telephone Encounter (Signed)
Copied from [place] [id number]. Topic: General - Other >> Apr 19, 2018  9:07 AM Yvette Rack wrote: Reason for CRM: pt calling stating that she would like to speak with Butch Penny she had a ear irrigation on Monday and she cant hear out of rt ear like it want pop feel like its plugged up please give her a call at [phone]

## 2018-04-19 NOTE — Telephone Encounter (Signed)
Error  Copied from [place] [id number]. Topic: General - Other >> Apr 19, 2018  9:07 AM Yvette Rack wrote: Reason for CRM: pt calling stating that she would like to speak with Butch Penny she had a ear irrigation on Monday and she cant hear out of rt ear like it want pop feel like its plugged up please give her a call at [phone]

## 2018-04-20 DIAGNOSIS — Z23 Encounter for immunization: Secondary | ICD-10-CM | POA: Diagnosis not present

## 2018-04-24 DIAGNOSIS — H6123 Impacted cerumen, bilateral: Secondary | ICD-10-CM | POA: Diagnosis not present

## 2018-06-26 ENCOUNTER — Other Ambulatory Visit: Payer: Self-pay | Admitting: Physician Assistant

## 2018-06-26 DIAGNOSIS — Z1231 Encounter for screening mammogram for malignant neoplasm of breast: Secondary | ICD-10-CM

## 2018-07-31 ENCOUNTER — Ambulatory Visit
Admission: RE | Admit: 2018-07-31 | Discharge: 2018-07-31 | Disposition: A | Discharge status: Home / Self Care | Payer: BLUE CROSS/BLUE SHIELD | Source: Ambulatory Visit | Attending: Physician Assistant | Admitting: Physician Assistant

## 2018-07-31 DIAGNOSIS — Z1231 Encounter for screening mammogram for malignant neoplasm of breast: Secondary | ICD-10-CM

## 2019-01-28 DIAGNOSIS — D225 Melanocytic nevi of trunk: Secondary | ICD-10-CM | POA: Diagnosis not present

## 2019-01-28 DIAGNOSIS — L820 Inflamed seborrheic keratosis: Secondary | ICD-10-CM | POA: Diagnosis not present

## 2019-01-28 DIAGNOSIS — L814 Other melanin hyperpigmentation: Secondary | ICD-10-CM | POA: Diagnosis not present

## 2019-01-28 DIAGNOSIS — L565 Disseminated superficial actinic porokeratosis (DSAP): Secondary | ICD-10-CM | POA: Diagnosis not present

## 2019-01-28 DIAGNOSIS — L821 Other seborrheic keratosis: Secondary | ICD-10-CM | POA: Diagnosis not present

## 2019-04-25 DIAGNOSIS — Z23 Encounter for immunization: Secondary | ICD-10-CM | POA: Diagnosis not present

## 2019-05-14 DIAGNOSIS — Z713 Dietary counseling and surveillance: Secondary | ICD-10-CM | POA: Diagnosis not present

## 2019-05-29 DIAGNOSIS — Z713 Dietary counseling and surveillance: Secondary | ICD-10-CM | POA: Diagnosis not present

## 2019-06-12 DIAGNOSIS — Z713 Dietary counseling and surveillance: Secondary | ICD-10-CM | POA: Diagnosis not present

## 2019-06-25 ENCOUNTER — Other Ambulatory Visit: Payer: Self-pay | Admitting: Physician Assistant

## 2019-06-25 DIAGNOSIS — Z1231 Encounter for screening mammogram for malignant neoplasm of breast: Secondary | ICD-10-CM

## 2019-06-26 DIAGNOSIS — Z713 Dietary counseling and surveillance: Secondary | ICD-10-CM | POA: Diagnosis not present

## 2019-07-10 DIAGNOSIS — Z713 Dietary counseling and surveillance: Secondary | ICD-10-CM | POA: Diagnosis not present

## 2019-07-15 DIAGNOSIS — H0230 Blepharochalasis unspecified eye, unspecified eyelid: Secondary | ICD-10-CM | POA: Diagnosis not present

## 2019-07-15 DIAGNOSIS — H57819 Brow ptosis, unspecified: Secondary | ICD-10-CM | POA: Diagnosis not present

## 2019-07-22 ENCOUNTER — Other Ambulatory Visit: Payer: Self-pay

## 2019-07-23 ENCOUNTER — Encounter: Payer: BLUE CROSS/BLUE SHIELD | Admitting: Physician Assistant

## 2019-07-31 DIAGNOSIS — Z713 Dietary counseling and surveillance: Secondary | ICD-10-CM | POA: Diagnosis not present

## 2019-08-06 ENCOUNTER — Other Ambulatory Visit: Payer: Self-pay

## 2019-08-07 ENCOUNTER — Encounter: Payer: Self-pay | Admitting: Physician Assistant

## 2019-08-07 ENCOUNTER — Ambulatory Visit (INDEPENDENT_AMBULATORY_CARE_PROVIDER_SITE_OTHER): Payer: BC Managed Care – PPO | Admitting: Physician Assistant

## 2019-08-07 VITALS — BP 110/78 | HR 84 | Temp 98.0°F | Ht 68.0 in | Wt 210.0 lb

## 2019-08-07 DIAGNOSIS — E7800 Pure hypercholesterolemia, unspecified: Secondary | ICD-10-CM | POA: Diagnosis not present

## 2019-08-07 DIAGNOSIS — Z0000 Encounter for general adult medical examination without abnormal findings: Secondary | ICD-10-CM

## 2019-08-07 DIAGNOSIS — K5190 Ulcerative colitis, unspecified, without complications: Secondary | ICD-10-CM | POA: Diagnosis not present

## 2019-08-07 DIAGNOSIS — E669 Obesity, unspecified: Secondary | ICD-10-CM | POA: Diagnosis not present

## 2019-08-07 DIAGNOSIS — Z713 Dietary counseling and surveillance: Secondary | ICD-10-CM | POA: Diagnosis not present

## 2019-08-07 DIAGNOSIS — R011 Cardiac murmur, unspecified: Secondary | ICD-10-CM

## 2019-08-07 NOTE — Progress Notes (Signed)
I acted as a Education administrator for [employer], PA-C [employer], LPN   Subjective:    Tamara Richardson is a 59 y.o. female and is here for a comprehensive physical exam.   HPI  Health Maintenance Due  Topic Date Due  . COLONOSCOPY  06/25/2009    Acute Concerns: None  Chronic Issues: UC -- highly affected by stres and foods HLD -- family history; due for recheck Obesity -- has tried [employer] in the past -- but could only do it for so long; currently very active and going to the gym; buying and eating junk food; enjoys chocolate; trying to switch it out with fruit. Has access to a dietitian at work that she talks to at times. Feels like mindless eating is an issue for her.   Health Maintenance: Immunizations -- UTD Colonoscopy -- done 7-8 yrs ago at [facility] Dr. Everlene Richardson Mammogram -- UTD, scheduled for Feb PAP -- N/A Hysterectomy Bone Density -- None Diet -- see above Sleep habits -- oftentimes is awake from 2-4am Exercise -- goes to gym regularly Weight -- Weight: 210 lb (95.3 kg)  Mood -- situational anxiety and depression with pandemic Weight history: Wt Readings from Last 10 Encounters:  08/07/19 210 lb (95.3 kg)  04/16/18 203 lb (92.1 kg)  04/09/18 204 lb (92.5 kg)  Body mass index is 31.93 kg/m. No LMP recorded. Patient has had a hysterectomy. Alcohol use: no excessive intake Tobacco use: none  Depression screen PHQ 2/9 08/07/2019  Decreased Interest 0  Down, Depressed, Hopeless 0  PHQ - 2 Score 0  Altered sleeping -  Tired, decreased energy -  Change in appetite -  Feeling bad or failure about yourself  -  Trouble concentrating -  Moving slowly or fidgety/restless -  Suicidal thoughts -  PHQ-9 Score -  Difficult doing work/chores -     Other providers/specialists: Patient Care Team: Tamara Richardson, [place] as PCP - General (Physician Assistant) Tamara [place], PsyD (Psychology)    PMHx, SurgHx, SocialHx, Medications, and Allergies were  reviewed in the Visit Navigator and updated as appropriate.   Past Medical History:  Diagnosis Date  . Hyperlipidemia 06/09/2014   genetic, was on statin for about 6 months  . Ulcerative colitis ([place]) 2001   mostly controlled with diet  . Vitamin D deficiency      Past Surgical History:  Procedure Laterality Date  . ABDOMINAL HYSTERECTOMY  2001  . FOOT SURGERY Bilateral   . TONSILLECTOMY AND ADENOIDECTOMY    . WISDOM TOOTH EXTRACTION       Family History  Problem Relation Age of Onset  . Heart disease Mother   . Heart attack Father   . Hypertension Father   . Breast cancer Maternal Aunt 110       died at age 60  . Colon cancer Neg Hx     Social History   Tobacco Use  . Smoking status: Never Smoker  . Smokeless tobacco: Never Used  Substance Use Topics  . Alcohol use: Yes    Alcohol/week: 1.0 standard drinks    Types: 1 Glasses of wine per week  . Drug use: Never    Review of Systems:   Review of Systems  Constitutional: Negative for chills, fever, malaise/fatigue and weight loss.  HENT: Negative for hearing loss, sinus pain and sore throat.   Respiratory: Negative for cough and hemoptysis.   Cardiovascular: Negative for chest pain, palpitations, leg swelling and PND.  Gastrointestinal: Negative for  abdominal pain, constipation, diarrhea, heartburn, nausea and vomiting.  Genitourinary: Negative for dysuria, frequency and urgency.  Musculoskeletal: Negative for back pain, myalgias and neck pain.  Skin: Negative for itching and rash.  Neurological: Negative for dizziness, tingling, seizures and headaches.  Endo/Heme/Allergies: Negative for polydipsia.  Psychiatric/Behavioral: Negative for depression. The patient is not nervous/anxious.       Objective:   BP 110/78 (BP Location: Left Arm, Patient Position: Sitting, Cuff Size: Large)   Pulse 84   Temp 98 F (36.7 C) (Temporal)   Ht 5\' 8"  (1.727 m)   Wt 210 lb (95.3 kg)   SpO2 97%   BMI 31.93 kg/m  Body  mass index is 31.93 kg/m.   General Appearance:    Alert, cooperative, no distress, appears stated age  Head:    Normocephalic, without obvious abnormality, atraumatic  Eyes:    PERRL, conjunctiva/corneas clear, EOM's intact, fundi    benign, both eyes  Ears:    Normal TM's and external ear canals, both ears  Nose:   Nares normal, septum midline, mucosa normal, no drainage    or sinus tenderness  Throat:   Lips, mucosa, and tongue normal; teeth and gums normal  Neck:   Supple, symmetrical, trachea midline, no adenopathy;    thyroid:  no enlargement/tenderness/nodules; no carotid   bruit or JVD  Back:     Symmetric, no curvature, ROM normal, no CVA tenderness  Lungs:     Clear to auscultation bilaterally, respirations unlabored  Chest Wall:    No tenderness or deformity   Heart:    Regular rate and rhythm, S1 and S2 normal, Grade II murmur, no rub or gallop  Breast Exam:    Deferred  Abdomen:     Soft, non-tender, bowel sounds active all four quadrants,    no masses, no organomegaly  Genitalia:    Deferred  Extremities:   Extremities normal, atraumatic, no cyanosis or edema  Pulses:   2+ and symmetric all extremities  Skin:   Skin color, texture, turgor normal, no rashes or lesions  Lymph nodes:   Cervical, supraclavicular, and axillary nodes normal  Neurologic:   CNII-XII intact, normal strength, sensation and reflexes    throughout     Assessment/Plan:   Tamara Richardson was seen today for annual exam.  Diagnoses and all orders for this visit:  Routine physical examination Today patient counseled on age appropriate routine health concerns for screening and prevention, each reviewed and up to date or declined. Immunizations reviewed and up to date or declined. Labs ordered and reviewed. Risk factors for depression reviewed and negative. Hearing function and visual acuity are intact. ADLs screened and addressed as needed. Functional ability and level of safety reviewed and appropriate.  Education, counseling and referrals performed based on assessed risks today. Patient provided with a copy of personalized plan for preventive services.  Ulcerative colitis without complications, unspecified location (HCC) Stable, management per GI.  HYPERCHOLESTEROLEMIA-PURE Will update lipid panel, recalculate ASCVD and determine need for intervention. -     Lipid panel -     ECHOCARDIOGRAM COMPLETE; Future  Obesity, unspecified classification, unspecified obesity type, unspecified whether serious comorbidity present Discussed working on snacks, choosing healthier options. List provided. Follow-up if wants to discuss medications (briefly discussed Saxenda.) -     CBC with Differential/Platelet -     Comprehensive metabolic panel -     Hemoglobin A1c  Murmur Will get echocardiogram for further evaluation and symptoms. -     ECHOCARDIOGRAM COMPLETE;  Future   Well Adult Exam: Labs ordered: Yes. Patient counseling was done. See below for items discussed. Discussed the patient's BMI.  The BMI is not in the acceptable range; BMI management plan is completed Follow up as needed for acute illness. Breast cancer screening: UTD. Cervical cancer screening: UTD   Patient Counseling: [x]    Nutrition: Stressed importance of moderation in sodium/caffeine intake, saturated fat and cholesterol, caloric balance, sufficient intake of fresh fruits, vegetables, fiber, calcium, iron, and 1 mg of folate supplement per day (for females capable of pregnancy).  [x]    Stressed the importance of regular exercise.   [x]    Substance Abuse: Discussed cessation/primary prevention of tobacco, alcohol, or other drug use; driving or other dangerous activities under the influence; availability of treatment for abuse.   [x]    Injury prevention: Discussed safety belts, safety helmets, smoke detector, smoking near bedding or upholstery.   [x]    Sexuality: Discussed sexually transmitted diseases, partner selection, use of  condoms, avoidance of unintended pregnancy  and contraceptive alternatives.  [x]    Dental health: Discussed importance of regular tooth brushing, flossing, and dental visits.  [x]    Health maintenance and immunizations reviewed. Please refer to Health maintenance section.   CMA or LPN served as scribe during this visit. History, Physical, and Plan performed by medical provider. The above documentation has been reviewed and is accurate and complete.  Tamara Coke, PA-C [place]

## 2019-08-07 NOTE — Patient Instructions (Addendum)
It was great to see you!  1. Work on your snacks! 2. Talk to your son about his sleep patterns and how they are affecting you! Sleep is important. 3. You will be contacted about your ultrasound for your heart.  Please go to the lab for blood work.   Our office will call you with your results unless you have chosen to receive results via MyChart.  If your blood work is normal we will follow-up each year for physicals and as scheduled for chronic medical problems.  If anything is abnormal we will treat accordingly and get you in for a follow-up.  Take care,  [facility] Maintenance, Female Adopting a healthy lifestyle and getting preventive care are important in promoting health and wellness. Ask your health care provider about:  The right schedule for you to have regular tests and exams.  Things you can do on your own to prevent diseases and keep yourself healthy. What should I know about diet, weight, and exercise? Eat a healthy diet   Eat a diet that includes plenty of vegetables, fruits, low-fat dairy products, and lean protein.  Do not eat a lot of foods that are high in solid fats, added sugars, or sodium. Maintain a healthy weight Body mass index (BMI) is used to identify weight problems. It estimates body fat based on height and weight. Your health care provider can help determine your BMI and help you achieve or maintain a healthy weight. Get regular exercise Get regular exercise. This is one of the most important things you can do for your health. Most adults should:  Exercise for at least 150 minutes each week. The exercise should increase your heart rate and make you sweat (moderate-intensity exercise).  Do strengthening exercises at least twice a week. This is in addition to the moderate-intensity exercise.  Spend less time sitting. Even light physical activity can be beneficial. Watch cholesterol and blood lipids Have your blood tested for lipids and  cholesterol at 61 years of age, then have this test every 5 years. Have your cholesterol levels checked more often if:  Your lipid or cholesterol levels are high.  You are older than 61 years of age.  You are at high risk for heart disease. What should I know about cancer screening? Depending on your health history and family history, you may need to have cancer screening at various ages. This may include screening for:  Breast cancer.  Cervical cancer.  Colorectal cancer.  Skin cancer.  Lung cancer. What should I know about heart disease, diabetes, and high blood pressure? Blood pressure and heart disease  High blood pressure causes heart disease and increases the risk of stroke. This is more likely to develop in people who have high blood pressure readings, are of African descent, or are overweight.  Have your blood pressure checked: ? Every 3-5 years if you are 33-33 years of age. ? Every year if you are 61 years old or older. Diabetes Have regular diabetes screenings. This checks your fasting blood sugar level. Have the screening done:  Once every three years after age 13 if you are at a normal weight and have a low risk for diabetes.  More often and at a younger age if you are overweight or have a high risk for diabetes. What should I know about preventing infection? Hepatitis B If you have a higher risk for hepatitis B, you should be screened for this virus. Talk with your health care provider  to find out if you are at risk for hepatitis B infection. Hepatitis C Testing is recommended for:  Everyone born from [birth year] through 1967.  Anyone with known risk factors for hepatitis C. Sexually transmitted infections (STIs)  Get screened for STIs, including gonorrhea and chlamydia, if: ? You are sexually active and are younger than 61 years of age. ? You are older than 61 years of age and your health care provider tells you that you are at risk for this type of  infection. ? Your sexual activity has changed since you were last screened, and you are at increased risk for chlamydia or gonorrhea. Ask your health care provider if you are at risk.  Ask your health care provider about whether you are at high risk for HIV. Your health care provider may recommend a prescription medicine to help prevent HIV infection. If you choose to take medicine to prevent HIV, you should first get tested for HIV. You should then be tested every 3 months for as long as you are taking the medicine. Pregnancy  If you are about to stop having your period (premenopausal) and you may become pregnant, seek counseling before you get pregnant.  Take 400 to 800 micrograms (mcg) of folic acid every day if you become pregnant.  Ask for birth control (contraception) if you want to prevent pregnancy. Osteoporosis and menopause Osteoporosis is a disease in which the bones lose minerals and strength with aging. This can result in bone fractures. If you are 51 years old or older, or if you are at risk for osteoporosis and fractures, ask your health care provider if you should:  Be screened for bone loss.  Take a calcium or vitamin D supplement to lower your risk of fractures.  Be given hormone replacement therapy (HRT) to treat symptoms of menopause. Follow these instructions at home: Lifestyle  Do not use any products that contain nicotine or tobacco, such as cigarettes, e-cigarettes, and chewing tobacco. If you need help quitting, ask your health care provider.  Do not use street drugs.  Do not share needles.  Ask your health care provider for help if you need support or information about quitting drugs. Alcohol use  Do not drink alcohol if: ? Your health care provider tells you not to drink. ? You are pregnant, may be pregnant, or are planning to become pregnant.  If you drink alcohol: ? Limit how much you use to 0-1 drink a day. ? Limit intake if you are  breastfeeding.  Be aware of how much alcohol is in your drink. In the [place] one drink equals one 12 oz bottle of beer (355 mL), one 5 oz glass of wine (148 mL), or one 1 oz glass of hard liquor (44 mL). General instructions  Schedule regular health, dental, and eye exams.  Stay current with your vaccines.  Tell your health care provider if: ? You often feel depressed. ? You have ever been abused or do not feel safe at home. Summary  Adopting a healthy lifestyle and getting preventive care are important in promoting health and wellness.  Follow your health care provider's instructions about healthy diet, exercising, and getting tested or screened for diseases.  Follow your health care provider's instructions on monitoring your cholesterol and blood pressure. This information is not intended to replace advice given to you by your health care provider. Make sure you discuss any questions you have with your health care provider. Document Revised: 07/04/2018 Document Reviewed: 07/04/2018 Elsevier Patient  Education  2020 Elsevier Inc.  

## 2019-08-08 LAB — COMPREHENSIVE METABOLIC PANEL
ALT: 19 U/L (ref 0–35)
AST: 17 U/L (ref 0–37)
Albumin: 4.6 g/dL (ref 3.5–5.2)
Alkaline Phosphatase: 65 U/L (ref 39–117)
BUN: 19 mg/dL (ref 6–23)
CO2: 27 mEq/L (ref 19–32)
Calcium: 9.3 mg/dL (ref 8.4–10.5)
Chloride: 105 mEq/L (ref 96–112)
Creatinine, Ser: 0.92 mg/dL (ref 0.40–1.20)
GFR: 62.24 mL/min (ref >60.00)
Glucose, Bld: 94 mg/dL (ref 70–99)
Potassium: 4.1 mEq/L (ref 3.5–5.1)
Sodium: 141 mEq/L (ref 135–145)
Total Bilirubin: 0.9 mg/dL (ref 0.2–1.2)
Total Protein: 7.2 g/dL (ref 6.0–8.3)

## 2019-08-08 LAB — CBC WITH DIFFERENTIAL/PLATELET
Basophils Absolute: 0 10*3/uL (ref 0.0–0.1)
Basophils Relative: 0.8 % (ref 0.0–3.0)
Eosinophils Absolute: 0.2 10*3/uL (ref 0.0–0.7)
Eosinophils Relative: 3.2 % (ref 0.0–5.0)
HCT: 41.5 % (ref 36.0–46.0)
Hemoglobin: 14 g/dL (ref 12.0–15.0)
Lymphocytes Relative: 41.3 % (ref 12.0–46.0)
Lymphs Abs: 2 10*3/uL (ref 0.7–4.0)
MCHC: 33.8 g/dL (ref 30.0–36.0)
MCV: 92.7 fl (ref 78.0–100.0)
Monocytes Absolute: 0.3 10*3/uL (ref 0.1–1.0)
Monocytes Relative: 7 % (ref 3.0–12.0)
Neutro Abs: 2.3 10*3/uL (ref 1.4–7.7)
Neutrophils Relative %: 47.7 % (ref 43.0–77.0)
Platelets: 268 10*3/uL (ref 150.0–400.0)
RBC: 4.47 Mil/uL (ref 3.87–5.11)
RDW: 12.8 % (ref 11.5–15.5)
WBC: 4.8 10*3/uL (ref 4.0–10.5)

## 2019-08-08 LAB — LIPID PANEL
Cholesterol: 244 mg/dL — ABNORMAL HIGH (ref 0–200)
HDL: 41.7 mg/dL (ref >39.00)
LDL Cholesterol: 163 mg/dL — ABNORMAL HIGH (ref 0–99)
NonHDL: 202
Total CHOL/HDL Ratio: 6
Triglycerides: 196 mg/dL — ABNORMAL HIGH (ref 0.0–149.0)
VLDL: 39.2 mg/dL (ref 0.0–40.0)

## 2019-08-08 LAB — HEMOGLOBIN A1C: Hgb A1c MFr Bld: 5.5 % (ref 4.6–6.5)

## 2019-08-09 ENCOUNTER — Other Ambulatory Visit: Payer: Self-pay | Admitting: Physician Assistant

## 2019-08-09 ENCOUNTER — Telehealth: Payer: Self-pay | Admitting: *Deleted

## 2019-08-09 MED ORDER — ATORVASTATIN CALCIUM 10 MG PO TABS: 10.0000 mg | ORAL_TABLET | Freq: Every day | ORAL | 1 refills | Status: DC

## 2019-08-09 NOTE — Telephone Encounter (Signed)
Pt calling had questions, was Echo scheduled.? Told pt order has been placed and someone will contact you to schedule an appt. Pt verbalized understanding. Also pt said she saw message from [facility] regarding labs and cholesterol and she is agreeable to start medication. Told her okay I will let Aldona Bar know and will send Rx to your pharmacy. Pt verbalized understanding.

## 2019-08-09 NOTE — Telephone Encounter (Signed)
Noted  

## 2019-08-09 NOTE — Telephone Encounter (Signed)
Tamara Richardson, please see message. 

## 2019-08-09 NOTE — Telephone Encounter (Signed)
Lipitor 10 mg sent.

## 2019-08-13 ENCOUNTER — Telehealth: Payer: Self-pay | Admitting: Physician Assistant

## 2019-08-13 ENCOUNTER — Telehealth: Payer: Self-pay

## 2019-08-13 NOTE — Telephone Encounter (Signed)
Cardiology has tried calling her - but her mailbox has been full.  I will try reaching out to her.

## 2019-08-13 NOTE — Telephone Encounter (Signed)
Tamara Richardson, can you look into this please. Thanks

## 2019-08-13 NOTE — Telephone Encounter (Signed)
Echo - CPT [id number]  The CPT code entered for this member does not require prior authorization at this time.  Myrtie Hawk Details Member #: Date of Birth: [id number] [birth year] Date of Service: Health Plan: 08/13/2019 [place] Ordering Provider: Payton Spark

## 2019-08-13 NOTE — Telephone Encounter (Signed)
Patient calling to follow up on appt regarding ultrasound on heart. Patient stated she havent heard anything regarding appt. Best contact number is [phone].

## 2019-08-14 ENCOUNTER — Other Ambulatory Visit: Payer: Self-pay

## 2019-08-14 ENCOUNTER — Ambulatory Visit (HOSPITAL_COMMUNITY): Payer: BC Managed Care – PPO | Attending: Cardiovascular Disease

## 2019-08-14 DIAGNOSIS — R011 Cardiac murmur, unspecified: Secondary | ICD-10-CM | POA: Diagnosis not present

## 2019-08-14 DIAGNOSIS — E7800 Pure hypercholesterolemia, unspecified: Secondary | ICD-10-CM | POA: Insufficient documentation

## 2019-08-20 ENCOUNTER — Telehealth: Payer: Self-pay | Admitting: Physician Assistant

## 2019-08-20 NOTE — Telephone Encounter (Signed)
error 

## 2019-08-26 ENCOUNTER — Other Ambulatory Visit: Payer: Self-pay

## 2019-08-26 ENCOUNTER — Ambulatory Visit
Admission: RE | Admit: 2019-08-26 | Discharge: 2019-08-26 | Disposition: A | Discharge status: Home / Self Care | Payer: BLUE CROSS/BLUE SHIELD | Source: Ambulatory Visit | Attending: Physician Assistant | Admitting: Physician Assistant

## 2019-08-26 DIAGNOSIS — Z1231 Encounter for screening mammogram for malignant neoplasm of breast: Secondary | ICD-10-CM | POA: Diagnosis not present

## 2019-10-03 IMAGING — MG DIGITAL SCREENING BILATERAL MAMMOGRAM WITH CAD
4 series · 4 of 4 positions shown · non-contrast
Comparison: Previous exam(s).

CLINICAL DATA: Screening.

EXAM:
DIGITAL SCREENING BILATERAL MAMMOGRAM WITH CAD

[R MLO]
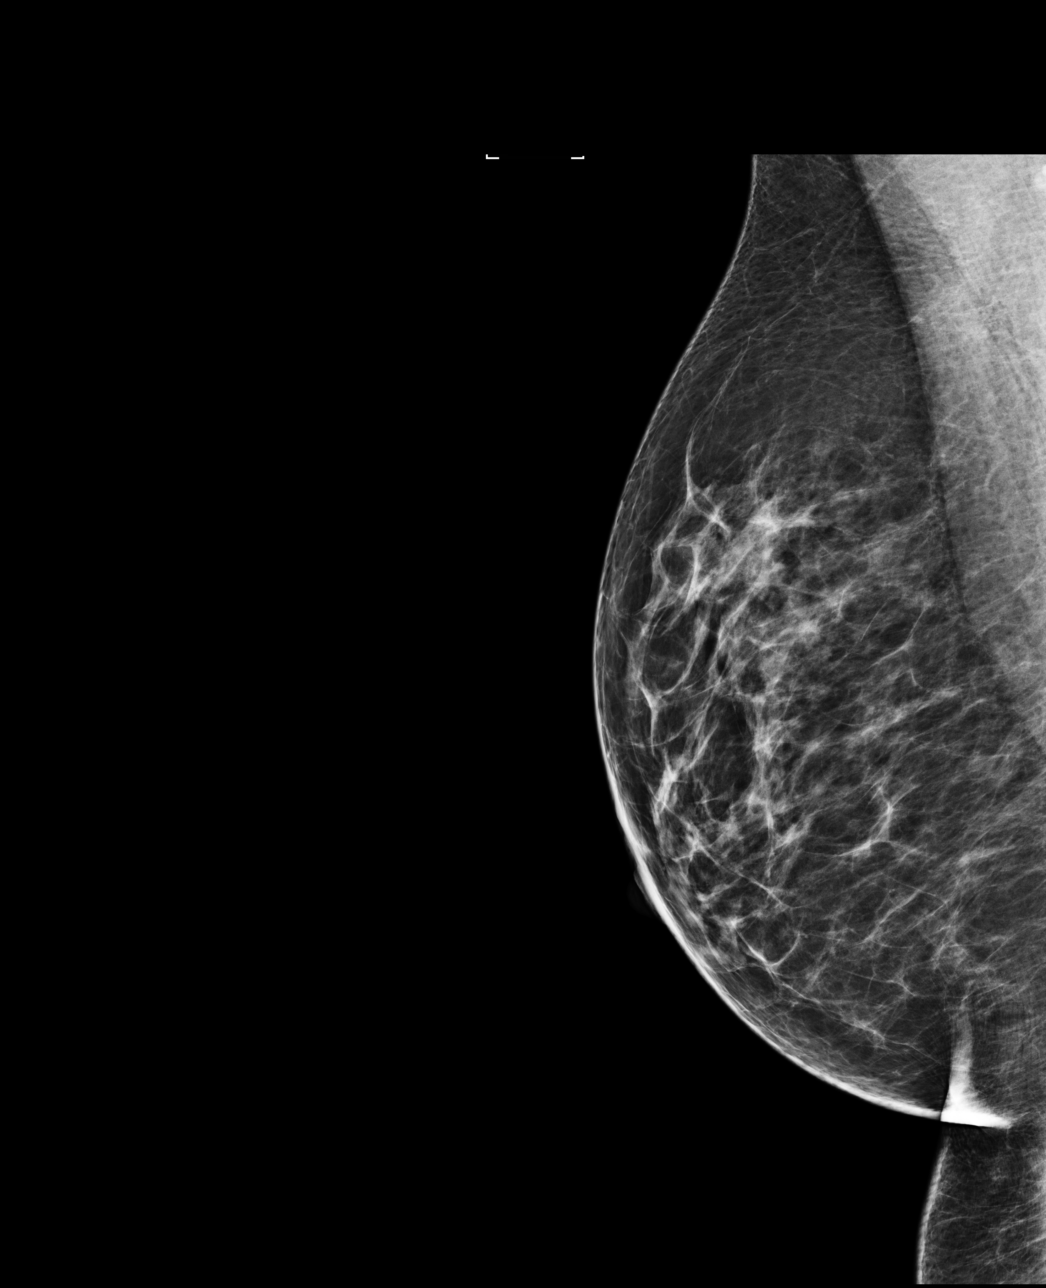

[L CC]
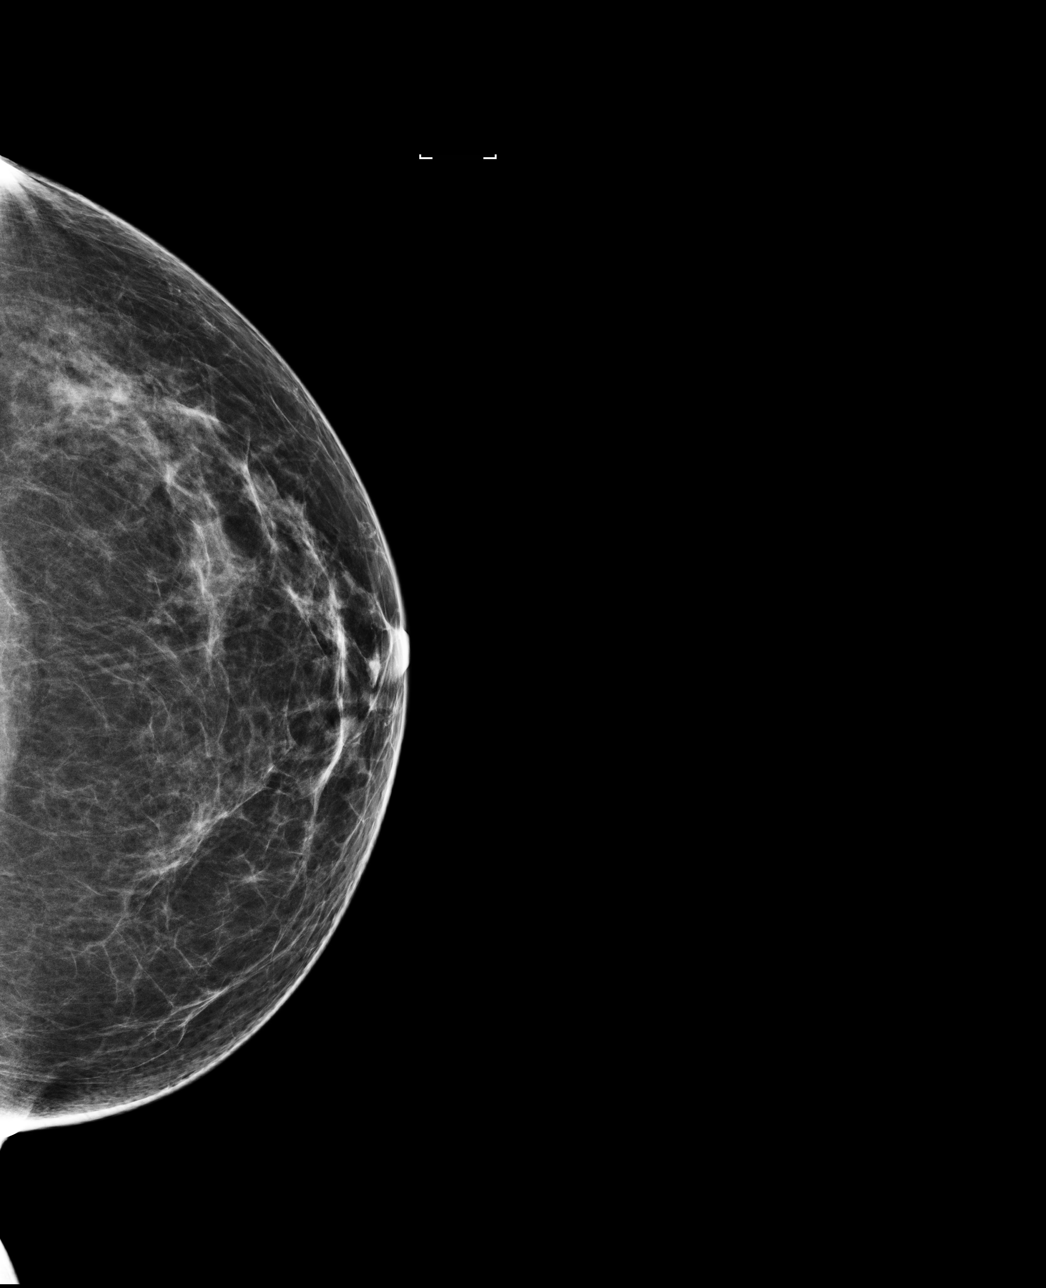

[L MLO]
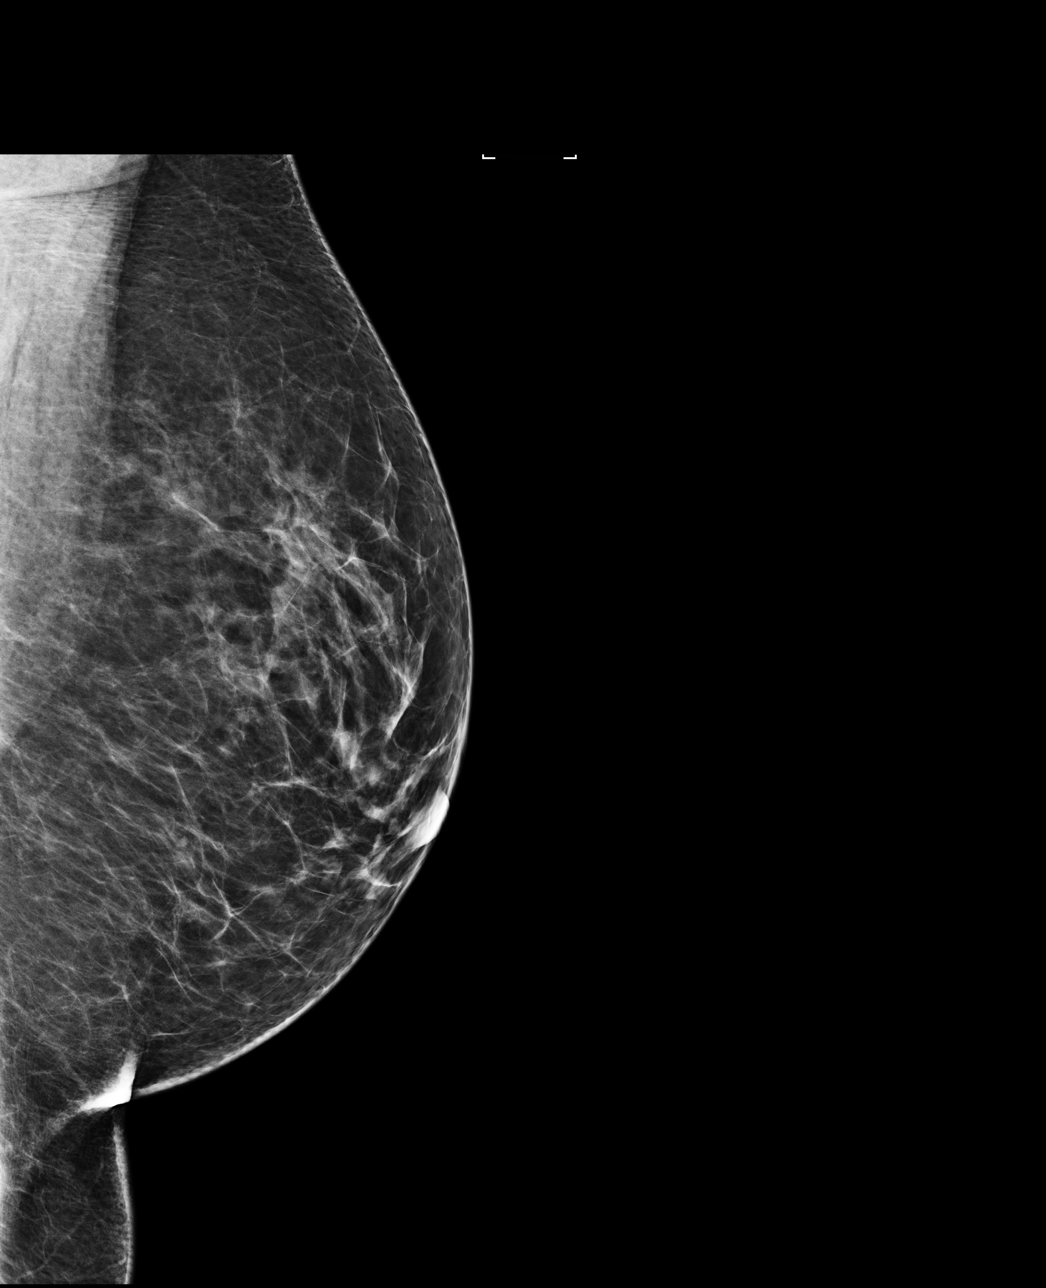

[R CC]
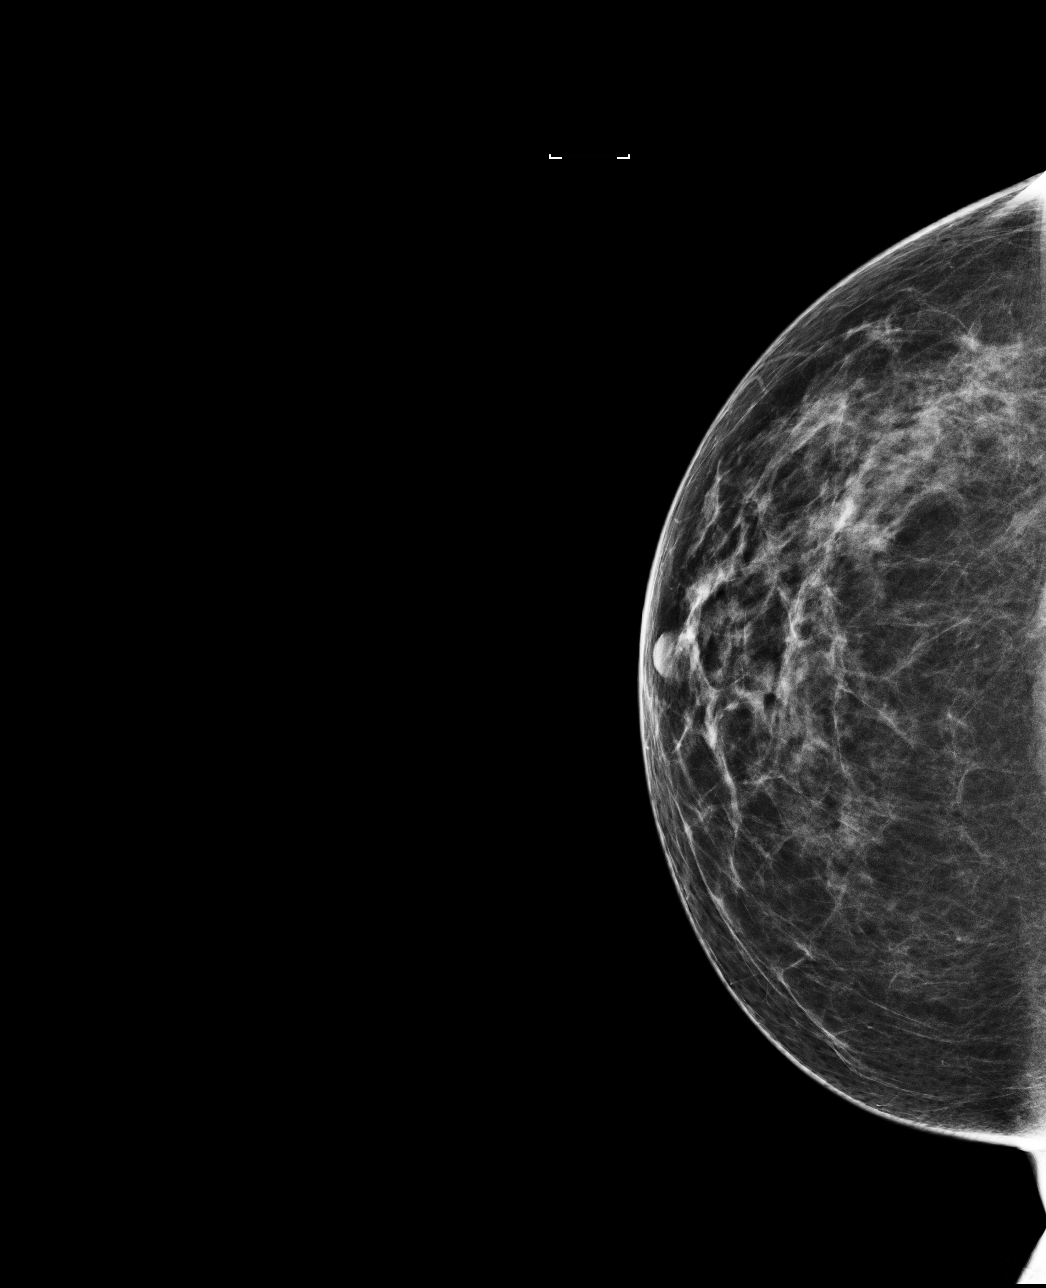

[4 of 4 positions shown; findings below may reference images not displayed]

ACR Breast Density Category b: There are scattered areas of
fibroglandular density.
FINDINGS: There are no findings suspicious for malignancy. Images were
processed with CAD.
IMPRESSION: No mammographic evidence of malignancy. A result letter of this
screening mammogram will be mailed directly to the patient.

RECOMMENDATION:
Screening mammogram in one year. (Code:AS-G-LCT)

BI-RADS CATEGORY  1: Negative.

## 2020-01-28 DIAGNOSIS — L565 Disseminated superficial actinic porokeratosis (DSAP): Secondary | ICD-10-CM | POA: Diagnosis not present

## 2020-01-28 DIAGNOSIS — D225 Melanocytic nevi of trunk: Secondary | ICD-10-CM | POA: Diagnosis not present

## 2020-01-28 DIAGNOSIS — L821 Other seborrheic keratosis: Secondary | ICD-10-CM | POA: Diagnosis not present

## 2020-01-28 DIAGNOSIS — L820 Inflamed seborrheic keratosis: Secondary | ICD-10-CM | POA: Diagnosis not present

## 2020-01-28 DIAGNOSIS — L814 Other melanin hyperpigmentation: Secondary | ICD-10-CM | POA: Diagnosis not present

## 2020-01-29 DIAGNOSIS — Z20822 Contact with and (suspected) exposure to covid-19: Secondary | ICD-10-CM | POA: Diagnosis not present

## 2020-02-03 ENCOUNTER — Other Ambulatory Visit: Payer: Self-pay | Admitting: Physician Assistant

## 2020-02-19 ENCOUNTER — Telehealth: Payer: Self-pay

## 2020-02-19 NOTE — Telephone Encounter (Signed)
Pt is requesting to talk to an assistant

## 2020-02-19 NOTE — Telephone Encounter (Signed)
Called pt back asked her how can I help you? Pt said Dermatologist prescribed a Topical cream called Lipitor pt said she is already taking Lipitor by mouth for cholesterol and wanted to know if it is the same. Asked pt are you sure it is lipitor? Pt said when she gets home she will take a picture of it and send thru My Chart. Told her okay I will ask Samantha then. Pt verbalized understanding.

## 2020-03-04 DIAGNOSIS — Z20822 Contact with and (suspected) exposure to covid-19: Secondary | ICD-10-CM | POA: Diagnosis not present

## 2020-04-23 DIAGNOSIS — Z23 Encounter for immunization: Secondary | ICD-10-CM | POA: Diagnosis not present

## 2020-05-29 DIAGNOSIS — Z713 Dietary counseling and surveillance: Secondary | ICD-10-CM | POA: Diagnosis not present

## 2020-06-10 DIAGNOSIS — Z713 Dietary counseling and surveillance: Secondary | ICD-10-CM | POA: Diagnosis not present

## 2020-06-24 DIAGNOSIS — Z713 Dietary counseling and surveillance: Secondary | ICD-10-CM | POA: Diagnosis not present

## 2020-07-03 ENCOUNTER — Encounter: Payer: Self-pay | Admitting: Physician Assistant

## 2020-07-08 DIAGNOSIS — Z713 Dietary counseling and surveillance: Secondary | ICD-10-CM | POA: Diagnosis not present

## 2020-07-20 DIAGNOSIS — Z20822 Contact with and (suspected) exposure to covid-19: Secondary | ICD-10-CM | POA: Diagnosis not present

## 2020-07-29 DIAGNOSIS — Z713 Dietary counseling and surveillance: Secondary | ICD-10-CM | POA: Diagnosis not present

## 2020-08-05 DIAGNOSIS — Z713 Dietary counseling and surveillance: Secondary | ICD-10-CM | POA: Diagnosis not present

## 2020-08-07 ENCOUNTER — Other Ambulatory Visit: Payer: Self-pay | Admitting: Physician Assistant

## 2020-09-02 ENCOUNTER — Other Ambulatory Visit: Payer: Self-pay

## 2020-09-02 ENCOUNTER — Ambulatory Visit (INDEPENDENT_AMBULATORY_CARE_PROVIDER_SITE_OTHER): Payer: BC Managed Care – PPO | Admitting: Physician Assistant

## 2020-09-02 ENCOUNTER — Encounter: Payer: Self-pay | Admitting: Physician Assistant

## 2020-09-02 VITALS — BP 98/60 | HR 86 | Temp 97.9°F | Ht 68.0 in | Wt 191.0 lb

## 2020-09-02 DIAGNOSIS — E663 Overweight: Secondary | ICD-10-CM

## 2020-09-02 DIAGNOSIS — E7800 Pure hypercholesterolemia, unspecified: Secondary | ICD-10-CM

## 2020-09-02 DIAGNOSIS — Z0001 Encounter for general adult medical examination with abnormal findings: Secondary | ICD-10-CM | POA: Diagnosis not present

## 2020-09-02 DIAGNOSIS — Z1211 Encounter for screening for malignant neoplasm of colon: Secondary | ICD-10-CM

## 2020-09-02 DIAGNOSIS — R519 Headache, unspecified: Secondary | ICD-10-CM

## 2020-09-02 DIAGNOSIS — K5190 Ulcerative colitis, unspecified, without complications: Secondary | ICD-10-CM | POA: Diagnosis not present

## 2020-09-02 LAB — CBC WITH DIFFERENTIAL/PLATELET
Basophils Absolute: 0 10*3/uL (ref 0.0–0.1)
Basophils Relative: 0.7 % (ref 0.0–3.0)
Eosinophils Absolute: 0.3 10*3/uL (ref 0.0–0.7)
Eosinophils Relative: 5.7 % — ABNORMAL HIGH (ref 0.0–5.0)
HCT: 39.9 % (ref 36.0–46.0)
Hemoglobin: 13.6 g/dL (ref 12.0–15.0)
Lymphocytes Relative: 27.3 % (ref 12.0–46.0)
Lymphs Abs: 1.3 10*3/uL (ref 0.7–4.0)
MCHC: 34.2 g/dL (ref 30.0–36.0)
MCV: 91.5 fl (ref 78.0–100.0)
Monocytes Absolute: 0.4 10*3/uL (ref 0.1–1.0)
Monocytes Relative: 9 % (ref 3.0–12.0)
Neutro Abs: 2.8 10*3/uL (ref 1.4–7.7)
Neutrophils Relative %: 57.3 % (ref 43.0–77.0)
Platelets: 238 10*3/uL (ref 150.0–400.0)
RBC: 4.36 Mil/uL (ref 3.87–5.11)
RDW: 12.8 % (ref 11.5–15.5)
WBC: 4.9 10*3/uL (ref 4.0–10.5)

## 2020-09-02 LAB — COMPREHENSIVE METABOLIC PANEL
ALT: 40 U/L — ABNORMAL HIGH (ref 0–35)
AST: 28 U/L (ref 0–37)
Albumin: 4.5 g/dL (ref 3.5–5.2)
Alkaline Phosphatase: 70 U/L (ref 39–117)
BUN: 19 mg/dL (ref 6–23)
CO2: 29 mEq/L (ref 19–32)
Calcium: 9.8 mg/dL (ref 8.4–10.5)
Chloride: 106 mEq/L (ref 96–112)
Creatinine, Ser: 0.88 mg/dL (ref 0.40–1.20)
GFR: 71.05 mL/min (ref >60.00)
Glucose, Bld: 100 mg/dL — ABNORMAL HIGH (ref 70–99)
Potassium: 4.8 mEq/L (ref 3.5–5.1)
Sodium: 139 mEq/L (ref 135–145)
Total Bilirubin: 1.4 mg/dL — ABNORMAL HIGH (ref 0.2–1.2)
Total Protein: 7.4 g/dL (ref 6.0–8.3)

## 2020-09-02 LAB — LIPID PANEL
Cholesterol: 143 mg/dL (ref 0–200)
HDL: 34.1 mg/dL — ABNORMAL LOW (ref >39.00)
LDL Cholesterol: 71 mg/dL (ref 0–99)
NonHDL: 108.6
Total CHOL/HDL Ratio: 4
Triglycerides: 190 mg/dL — ABNORMAL HIGH (ref 0.0–149.0)
VLDL: 38 mg/dL (ref 0.0–40.0)

## 2020-09-02 MED ORDER — CYCLOBENZAPRINE HCL 10 MG PO TABS: 10.0000 mg | ORAL_TABLET | Freq: Three times a day (TID) | ORAL | 0 refills | Status: DC | PRN

## 2020-09-02 NOTE — Progress Notes (Signed)
Subjective:    Tamara Richardson is a 60 y.o. female and is here for a comprehensive physical exam.   HPI  Health Maintenance Due  Topic Date Due  . COLONOSCOPY (Pts 45-58yrs Insurance coverage will need to be confirmed)  Never done    Acute Concerns: Headaches -- bilateral temporal and back of neck. Has been waking her up in the middle of the night. Noticed it about a week ago. Does feel like she has been under a lot of stress. Drinking plenty of water, at least 40 oz water daily. Was recently doing [place], but not in the past few months. Denies: vision changes, weakness, slurred speech, confusion. Has recently started working out at the gym in the past week. Has had HA in the past. Does not feel like this is the worst HA of life. No hx of migraines. Currently taking 1-2 tylenol or ibuprofen daily. Denies changes to caffeine intake. Has occasional insomnia.  BP Readings from Last 3 Encounters:  09/02/20 98/60  08/07/19 110/78  04/16/18 110/70    Chronic Issues: UC -- had a bout of this in December. Would like to update colonoscopy. HLD -- taking 10 mg daily. She is hoping labs look better with this medication and recent dietary changes.  Health Maintenance: Immunizations -- UTD Colonoscopy -- overdue, will place referral Mammogram -- needs to schedule PAP -- n/a Diet -- overall healthy, recently did [employer] Sleep habits -- insomnia at times due to stress Exercise -- as able Weight -- Weight: 191 lb (86.6 kg)  Mood -- situational anxiety Weight history: Wt Readings from Last 10 Encounters:  09/02/20 191 lb (86.6 kg)  08/07/19 210 lb (95.3 kg)  04/16/18 203 lb (92.1 kg)  04/09/18 204 lb (92.5 kg)   Body mass index is 29.04 kg/m. No LMP recorded. Patient has had a hysterectomy. Alcohol use: less than usual; no more than 1 glass per week Tobacco use: none  Depression screen [facility] 2/9 09/02/2020  Decreased Interest 0  Down, Depressed, Hopeless 1  PHQ - 2 Score 1   Altered sleeping 1  Tired, decreased energy 0  Change in appetite 0  Feeling bad or failure about yourself  1  Trouble concentrating 1  Moving slowly or fidgety/restless 0  Suicidal thoughts 0  PHQ-9 Score 4  Difficult doing work/chores Not difficult at all   UTD with dentist  Other providers/specialists: Patient Care Team: Inda Coke, [place] as PCP - General (Physician Assistant) Geri [place], PsyD (Inactive) (Psychology)    PMHx, SurgHx, SocialHx, Medications, and Allergies were reviewed in the Visit Navigator and updated as appropriate.   Past Medical History:  Diagnosis Date  . Hyperlipidemia 06/09/2014   genetic, was on statin for about 6 months  . Ulcerative colitis ([place]) 2001   mostly controlled with diet  . Vitamin D deficiency      Past Surgical History:  Procedure Laterality Date  . ABDOMINAL HYSTERECTOMY  2001  . FOOT SURGERY Bilateral   . TONSILLECTOMY AND ADENOIDECTOMY    . WISDOM TOOTH EXTRACTION       Family History  Problem Relation Age of Onset  . Heart disease Mother   . Heart attack Father   . Hypertension Father   . Breast cancer Maternal Aunt 70       died at age 79  . Colon cancer Neg Hx     Social History   Tobacco Use  . Smoking status: Never Smoker  . Smokeless tobacco:  Never Used  Vaping Use  . Vaping Use: Never used  Substance Use Topics  . Alcohol use: Yes    Alcohol/week: 1.0 standard drink    Types: 1 Glasses of wine per week  . Drug use: Never    Review of Systems:   Review of Systems  Constitutional: Negative for chills, fever, malaise/fatigue and weight loss.  HENT: Negative for hearing loss, sinus pain and sore throat.   Respiratory: Negative for cough and hemoptysis.   Cardiovascular: Negative for chest pain, palpitations, leg swelling and PND.  Gastrointestinal: Negative for abdominal pain, constipation, diarrhea, heartburn, nausea and vomiting.  Genitourinary: Negative for dysuria, frequency and  urgency.  Musculoskeletal: Negative for back pain, myalgias and neck pain.  Skin: Negative for itching and rash.  Neurological: Positive for headaches. Negative for dizziness, tingling and seizures.  Endo/Heme/Allergies: Negative for polydipsia.  Psychiatric/Behavioral: Negative for depression. The patient is not nervous/anxious.     Objective:   BP 98/60 (BP Location: Right Arm, Patient Position: Sitting, Cuff Size: Normal)   Pulse 86   Temp 97.9 F (36.6 C) (Temporal)   Ht 5\' 8"  (1.727 m)   Wt 191 lb (86.6 kg)   SpO2 (!) 66%   BMI 29.04 kg/m  Body mass index is 29.04 kg/m.   General Appearance:    Alert, cooperative, no distress, appears stated age  Head:    Normocephalic, without obvious abnormality, atraumatic  Eyes:    PERRL, conjunctiva/corneas clear, EOM's intact, fundi    benign, both eyes  Ears:    Normal TM's and external ear canals, both ears  Nose:   Nares normal, septum midline, mucosa normal, no drainage    or sinus tenderness  Throat:   Lips, mucosa, and tongue normal; teeth and gums normal  Neck:   Supple, symmetrical, trachea midline, no adenopathy;    thyroid:  no enlargement/tenderness/nodules; no carotid   bruit or JVD  Back:     Symmetric, no curvature, ROM normal, no CVA tenderness  Lungs:     Clear to auscultation bilaterally, respirations unlabored  Chest Wall:    No tenderness or deformity   Heart:    Regular rate and rhythm, S1 and S2 normal, systolic murmur, no rub or gallop  Breast Exam:    Deferred  Abdomen:     Soft, non-tender, bowel sounds active all four quadrants,    no masses, no organomegaly  Genitalia:    Deferred  Extremities:   Extremities normal, atraumatic, no cyanosis or edema  Pulses:   2+ and symmetric all extremities  Skin:   Skin color, texture, turgor normal, no rashes or lesions  Lymph nodes:   Cervical, supraclavicular, and axillary nodes normal  Neurologic:   CNII-XII intact, normal strength, sensation and reflexes     throughout   Assessment/Plan:   Tamara Richardson was seen today for annual exam, hyperlipidemia, depression and headache.  Diagnoses and all orders for this visit:  Encounter for general adult medical examination with abnormal findings Today patient counseled on age appropriate routine health concerns for screening and prevention, each reviewed and up to date or declined. Immunizations reviewed and up to date or declined. Labs ordered and reviewed. Risk factors for depression reviewed and negative. Hearing function and visual acuity are intact. ADLs screened and addressed as needed. Functional ability and level of safety reviewed and appropriate. Education, counseling and referrals performed based on assessed risks today. Patient provided with a copy of personalized plan for preventive services.  Frequent headaches Neuro  exam normal. Due to her age and the fact that this is waking her up at night, I recommended CT scan for further evaluation. She declined this. I discussed that I cannot rule out stroke or other intracranial abnormality without this testing and she verbalized understanding. She will follow-up with [place] if worsening. Will trial low dose flexeril to see if this can help with cervical muscle tension. ER precautions reviewed and provided on AVS. -     CBC with Differential/Platelet -     Comprehensive metabolic panel  Ulcerative colitis without complications, unspecified location [facility]) Update colonoscopy for colon CA screening -- referral placed.  Pure hypercholesterolemia Update lipid panel and adjust lipitor 10 mg daily as indicated. -     Lipid panel  Overweight Continue healthy diet and exercise as able. -     CBC with Differential/Platelet -     Comprehensive metabolic panel  Special screening for malignant neoplasms, colon -     Ambulatory referral to Gastroenterology  Other orders -     cyclobenzaprine (FLEXERIL) 10 MG tablet; Take 1 tablet (10 mg total) by mouth 3 (three)  times daily as needed for muscle spasms.   Well Adult Exam: Labs ordered: Yes. Patient counseling was done. See below for items discussed. Discussed the patient's BMI.  The BMI is not in the acceptable range; BMI management plan is completed Follow up as needed for acute illness. Breast cancer screening: counseled. Cervical cancer screening: n/a   Patient Counseling: [x]    Nutrition: Stressed importance of moderation in sodium/caffeine intake, saturated fat and cholesterol, caloric balance, sufficient intake of fresh fruits, vegetables, fiber, calcium, iron, and 1 mg of folate supplement per day (for females capable of pregnancy).  [x]    Stressed the importance of regular exercise.   [x]    Substance Abuse: Discussed cessation/primary prevention of tobacco, alcohol, or other drug use; driving or other dangerous activities under the influence; availability of treatment for abuse.   [x]    Injury prevention: Discussed safety belts, safety helmets, smoke detector, smoking near bedding or upholstery.   [x]    Sexuality: Discussed sexually transmitted diseases, partner selection, use of condoms, avoidance of unintended pregnancy  and contraceptive alternatives.  [x]    Dental health: Discussed importance of regular tooth brushing, flossing, and dental visits.  [x]    Health maintenance and immunizations reviewed. Please refer to Health maintenance section.   Inda Coke, PA-C [place]

## 2020-09-02 NOTE — Patient Instructions (Addendum)
It was great to see you!  Please go to the lab for blood work.   Please call the [facility] will be contacted about your GI referral. Trial the flexeril muscle relaxer for tension headaches  Headache Precautions: Contact a doctor if:  Your symptoms are not helped by medicine.  You have a headache that feels different than the other headaches.  You feel sick to your stomach (nauseous) or you throw up (vomit).  You have a fever. Get help right away if:  Your headache gets very bad quickly.  Your headache gets worse after a lot of physical activity.  You keep throwing up.  You have a stiff neck.  You have trouble seeing.  You have trouble speaking.  You have pain in the eye or ear.  Your muscles are weak or you lose muscle control.  You lose your balance or have trouble walking.  You feel like you will pass out (faint) or you pass out.  You are mixed up (confused).  You have a seizure.   Our office will call you with your results unless you have chosen to receive results via MyChart.  If your blood work is normal we will follow-up each year for physicals and as scheduled for chronic medical problems.  If anything is abnormal we will treat accordingly and get you in for a follow-up.  Take care,  [facility] Maintenance, Female Adopting a healthy lifestyle and getting preventive care are important in promoting health and wellness. Ask your health care provider about:  The right schedule for you to have regular tests and exams.  Things you can do on your own to prevent diseases and keep yourself healthy. What should I know about diet, weight, and exercise? Eat a healthy diet  Eat a diet that includes plenty of vegetables, fruits, low-fat dairy products, and lean protein.  Do not eat a lot of foods that are high in solid fats, added sugars, or sodium.   Maintain a healthy weight Body mass index (BMI) is used to identify weight problems. It  estimates body fat based on height and weight. Your health care provider can help determine your BMI and help you achieve or maintain a healthy weight. Get regular exercise Get regular exercise. This is one of the most important things you can do for your health. Most adults should:  Exercise for at least 150 minutes each week. The exercise should increase your heart rate and make you sweat (moderate-intensity exercise).  Do strengthening exercises at least twice a week. This is in addition to the moderate-intensity exercise.  Spend less time sitting. Even light physical activity can be beneficial. Watch cholesterol and blood lipids Have your blood tested for lipids and cholesterol at 62 years of age, then have this test every 5 years. Have your cholesterol levels checked more often if:  Your lipid or cholesterol levels are high.  You are older than 62 years of age.  You are at high risk for heart disease. What should I know about cancer screening? Depending on your health history and family history, you may need to have cancer screening at various ages. This may include screening for:  Breast cancer.  Cervical cancer.  Colorectal cancer.  Skin cancer.  Lung cancer. What should I know about heart disease, diabetes, and high blood pressure? Blood pressure and heart disease  High blood pressure causes heart disease and increases the risk of stroke. This is more likely to develop in people  who have high blood pressure readings, are of African descent, or are overweight.  Have your blood pressure checked: ? Every 3-5 years if you are 58-6 years of age. ? Every year if you are 42 years old or older. Diabetes Have regular diabetes screenings. This checks your fasting blood sugar level. Have the screening done:  Once every three years after age 44 if you are at a normal weight and have a low risk for diabetes.  More often and at a younger age if you are overweight or have a high  risk for diabetes. What should I know about preventing infection? Hepatitis B If you have a higher risk for hepatitis B, you should be screened for this virus. Talk with your health care provider to find out if you are at risk for hepatitis B infection. Hepatitis C Testing is recommended for:  Everyone born from [birth year] through 1967.  Anyone with known risk factors for hepatitis C. Sexually transmitted infections (STIs)  Get screened for STIs, including gonorrhea and chlamydia, if: ? You are sexually active and are younger than 62 years of age. ? You are older than 62 years of age and your health care provider tells you that you are at risk for this type of infection. ? Your sexual activity has changed since you were last screened, and you are at increased risk for chlamydia or gonorrhea. Ask your health care provider if you are at risk.  Ask your health care provider about whether you are at high risk for HIV. Your health care provider may recommend a prescription medicine to help prevent HIV infection. If you choose to take medicine to prevent HIV, you should first get tested for HIV. You should then be tested every 3 months for as long as you are taking the medicine. Pregnancy  If you are about to stop having your period (premenopausal) and you may become pregnant, seek counseling before you get pregnant.  Take 400 to 800 micrograms (mcg) of folic acid every day if you become pregnant.  Ask for birth control (contraception) if you want to prevent pregnancy. Osteoporosis and menopause Osteoporosis is a disease in which the bones lose minerals and strength with aging. This can result in bone fractures. If you are 61 years old or older, or if you are at risk for osteoporosis and fractures, ask your health care provider if you should:  Be screened for bone loss.  Take a calcium or vitamin D supplement to lower your risk of fractures.  Be given hormone replacement therapy (HRT) to treat  symptoms of menopause. Follow these instructions at home: Lifestyle  Do not use any products that contain nicotine or tobacco, such as cigarettes, e-cigarettes, and chewing tobacco. If you need help quitting, ask your health care provider.  Do not use street drugs.  Do not share needles.  Ask your health care provider for help if you need support or information about quitting drugs. Alcohol use  Do not drink alcohol if: ? Your health care provider tells you not to drink. ? You are pregnant, may be pregnant, or are planning to become pregnant.  If you drink alcohol: ? Limit how much you use to 0-1 drink a day. ? Limit intake if you are breastfeeding.  Be aware of how much alcohol is in your drink. In the [place] one drink equals one 12 oz bottle of beer (355 mL), one 5 oz glass of wine (148 mL), or one 1 oz glass of hard liquor (  44 mL). General instructions  Schedule regular health, dental, and eye exams.  Stay current with your vaccines.  Tell your health care provider if: ? You often feel depressed. ? You have ever been abused or do not feel safe at home. Summary  Adopting a healthy lifestyle and getting preventive care are important in promoting health and wellness.  Follow your health care provider's instructions about healthy diet, exercising, and getting tested or screened for diseases.  Follow your health care provider's instructions on monitoring your cholesterol and blood pressure. This information is not intended to replace advice given to you by your health care provider. Make sure you discuss any questions you have with your health care provider. Document Revised: 07/04/2018 Document Reviewed: 07/04/2018 Elsevier Patient Education  2021 [employer].

## 2020-09-03 ENCOUNTER — Other Ambulatory Visit: Payer: Self-pay | Admitting: Physician Assistant

## 2020-09-03 DIAGNOSIS — R7989 Other specified abnormal findings of blood chemistry: Secondary | ICD-10-CM

## 2020-10-05 ENCOUNTER — Other Ambulatory Visit: Payer: Self-pay | Admitting: Physician Assistant

## 2020-10-05 DIAGNOSIS — Z1231 Encounter for screening mammogram for malignant neoplasm of breast: Secondary | ICD-10-CM

## 2020-10-27 ENCOUNTER — Telehealth: Payer: Self-pay

## 2020-10-27 NOTE — Telephone Encounter (Signed)
Error

## 2020-11-20 ENCOUNTER — Other Ambulatory Visit: Payer: Self-pay

## 2020-11-20 ENCOUNTER — Ambulatory Visit
Admission: RE | Admit: 2020-11-20 | Discharge: 2020-11-20 | Disposition: A | Discharge status: Home / Self Care | Payer: BC Managed Care – PPO | Source: Ambulatory Visit

## 2020-11-20 DIAGNOSIS — Z1231 Encounter for screening mammogram for malignant neoplasm of breast: Secondary | ICD-10-CM | POA: Diagnosis not present

## 2021-02-14 ENCOUNTER — Other Ambulatory Visit: Payer: Self-pay | Admitting: Physician Assistant

## 2021-08-02 ENCOUNTER — Telehealth (INDEPENDENT_AMBULATORY_CARE_PROVIDER_SITE_OTHER): Payer: 59 | Admitting: Family

## 2021-08-02 ENCOUNTER — Encounter: Payer: Self-pay | Admitting: Family

## 2021-08-02 VITALS — Ht 68.0 in | Wt 190.9 lb

## 2021-08-02 DIAGNOSIS — R053 Chronic cough: Secondary | ICD-10-CM | POA: Diagnosis not present

## 2021-08-02 MED ORDER — PREDNISONE 20 MG PO TABS: 20.0000 mg | ORAL_TABLET | Freq: Every day | ORAL | 0 refills | Status: DC

## 2021-08-02 MED ORDER — ALBUTEROL SULFATE HFA 108 (90 BASE) MCG/ACT IN AERS: 2.0000 | INHALATION_SPRAY | Freq: Four times a day (QID) | RESPIRATORY_TRACT | 0 refills | Status: AC | PRN

## 2021-08-02 NOTE — Assessment & Plan Note (Signed)
pt had covid 2 weeks ago, did not take the antiviral. reports all sx better except for ongoing cough. hx of exercise induced asthma, denies wheezing, but reports chest tightness when exerting herself. sending Albuterol inhaler and low dose prednisone, pt advised on use & SE.

## 2021-08-02 NOTE — Progress Notes (Signed)
MyChart Video Visit    Virtual Visit via Video Note   This visit type was conducted due to national recommendations for restrictions regarding the COVID-19 Pandemic (e.g. social distancing) in an effort to limit this patient's exposure and mitigate transmission in our community. This patient is at least at moderate risk for complications without adequate follow up. This format is felt to be most appropriate for this patient at this time. Physical exam was limited by quality of the video and audio technology used for the visit. CMA was able to get the patient set up on a video visit.  Patient location: Home. Patient and provider in visit Provider location: Office  I discussed the limitations of evaluation and management by telemedicine and the availability of in person appointments. The patient expressed understanding and agreed to proceed.  Visit Date: 08/02/2021  Today's healthcare provider: Jeanie Sewer, NP     Subjective:    Patient ID: Tamara Richardson, female    DOB: 1961/06/06, 61 y.o.   MRN: [id number]  Chief Complaint  Patient presents with   Cough    Had Covid 2 weeks ago. She says that she is having hard time sleeping at night. She takes Dayquil and Nyquil.    HPI Upper Respiratory Infection: Symptoms include congestion, no  fever, and non productive cough.  Onset of symptoms was 2 weeks ago, unchanged since that time. She is drinking moderate amounts of fluids. Evaluation to date: none.  Treatment to date: none.    Past Medical History:  Diagnosis Date   Hyperlipidemia 06/09/2014   genetic, was on statin for about 6 months   Ulcerative colitis ([place]) 2001   mostly controlled with diet   Vitamin D deficiency     Past Surgical History:  Procedure Laterality Date   ABDOMINAL HYSTERECTOMY  2001   FOOT SURGERY Bilateral    TONSILLECTOMY AND ADENOIDECTOMY     WISDOM TOOTH EXTRACTION      Outpatient Medications Prior to Visit  Medication Sig  Dispense Refill   atorvastatin (LIPITOR) 10 MG tablet TAKE 1 TABLET BY MOUTH EVERY DAY 90 tablet 1   Cholecalciferol (VITAMIN D) 50 MCG (2000 UT) tablet Take 2,000 Units by mouth daily.     naproxen sodium (ALEVE) 220 MG tablet Take by mouth as needed.     cyclobenzaprine (FLEXERIL) 10 MG tablet Take 1 tablet (10 mg total) by mouth 3 (three) times daily as needed for muscle spasms. (Patient not taking: Reported on 08/02/2021) 30 tablet 0   Thiamine HCl (VITAMIN B-1 PO) Take by mouth. (Patient not taking: Reported on 08/02/2021)     No facility-administered medications prior to visit.    Allergies  Allergen Reactions   Codeine    Uncaria [place]osa (Cats Claw)     Severe allergy to cats        Objective:     Physical Exam Vitals and nursing note reviewed.  Constitutional:      General: She is not in acute distress.    Appearance: Normal appearance.  HENT:     Head: Normocephalic.  Pulmonary:     Effort: No respiratory distress.  Musculoskeletal:     Cervical back: Normal range of motion.  Skin:    General: Skin is dry.     Coloration: Skin is not pale.  Neurological:     Mental Status: She is alert and oriented to person, place, and time.  Psychiatric:        Mood and Affect: Mood  normal.   Ht 5\' 8"  (1.727 m)    Wt 190 lb 14.7 oz (86.6 kg)    BMI 29.03 kg/m   Wt Readings from Last 3 Encounters:  08/02/21 190 lb 14.7 oz (86.6 kg)  09/02/20 191 lb (86.6 kg)  08/07/19 210 lb (95.3 kg)       Assessment & Plan:   Problem List Items Addressed This Visit       Other   Persistent cough - Primary    pt had covid 2 weeks ago, did not take the antiviral. reports all sx better except for ongoing cough. hx of exercise induced asthma, denies wheezing, but reports chest tightness when exerting herself. sending Albuterol inhaler and low dose prednisone, pt advised on use & SE.      Relevant Medications   albuterol (VENTOLIN HFA) 108 (90 Base) MCG/ACT inhaler   predniSONE  (DELTASONE) 20 MG tablet    I discussed the assessment and treatment plan with the patient. The patient was provided an opportunity to ask questions and all were answered. The patient agreed with the plan and demonstrated an understanding of the instructions.   The patient was advised to call back or seek an in-person evaluation if the symptoms worsen or if the condition fails to improve as anticipated.  I provided 23 minutes of face-to-face time during this encounter.   Jeanie Sewer, NP [place] [phone] (phone) [phone] (fax)  [place]

## 2021-08-28 ENCOUNTER — Other Ambulatory Visit: Payer: Self-pay | Admitting: Family Medicine

## 2021-09-14 ENCOUNTER — Other Ambulatory Visit: Payer: Self-pay | Admitting: Physician Assistant

## 2021-09-14 DIAGNOSIS — Z1231 Encounter for screening mammogram for malignant neoplasm of breast: Secondary | ICD-10-CM

## 2021-11-23 ENCOUNTER — Ambulatory Visit
Admission: RE | Admit: 2021-11-23 | Discharge: 2021-11-23 | Disposition: A | Discharge status: Home / Self Care | Payer: 59 | Source: Ambulatory Visit | Attending: Physician Assistant | Admitting: Physician Assistant

## 2021-11-23 DIAGNOSIS — Z1231 Encounter for screening mammogram for malignant neoplasm of breast: Secondary | ICD-10-CM | POA: Diagnosis not present

## 2021-11-30 IMAGING — MG DIGITAL SCREENING BILAT W/ CAD
4 series · 4 of 4 positions shown · non-contrast
Comparison: Previous exam(s).

CLINICAL DATA: Screening.

EXAM:
DIGITAL SCREENING BILATERAL MAMMOGRAM WITH CAD

[L MLO]
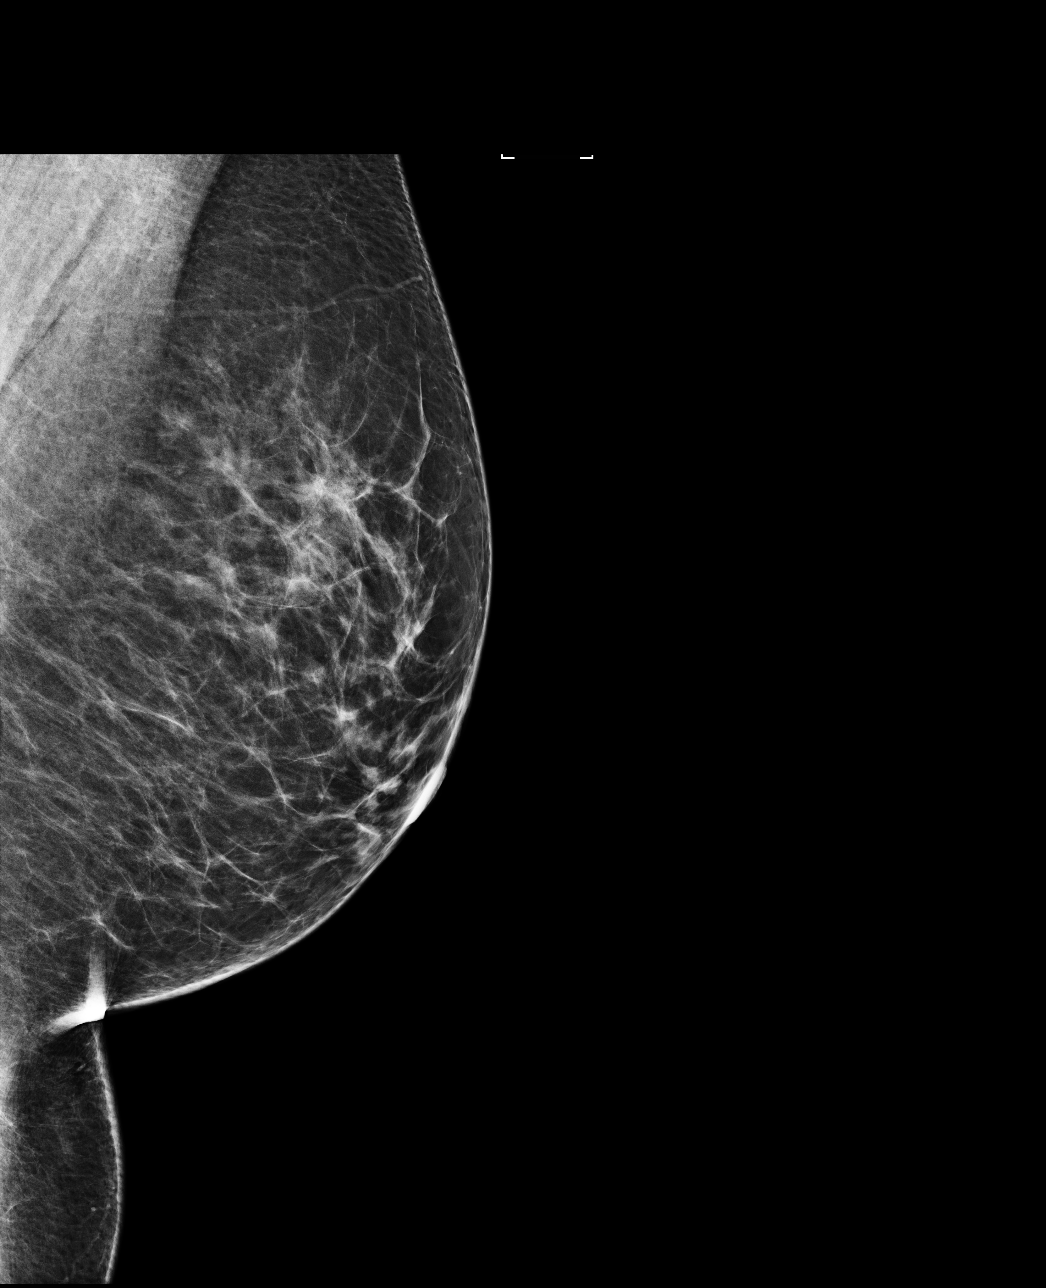

[L CC]
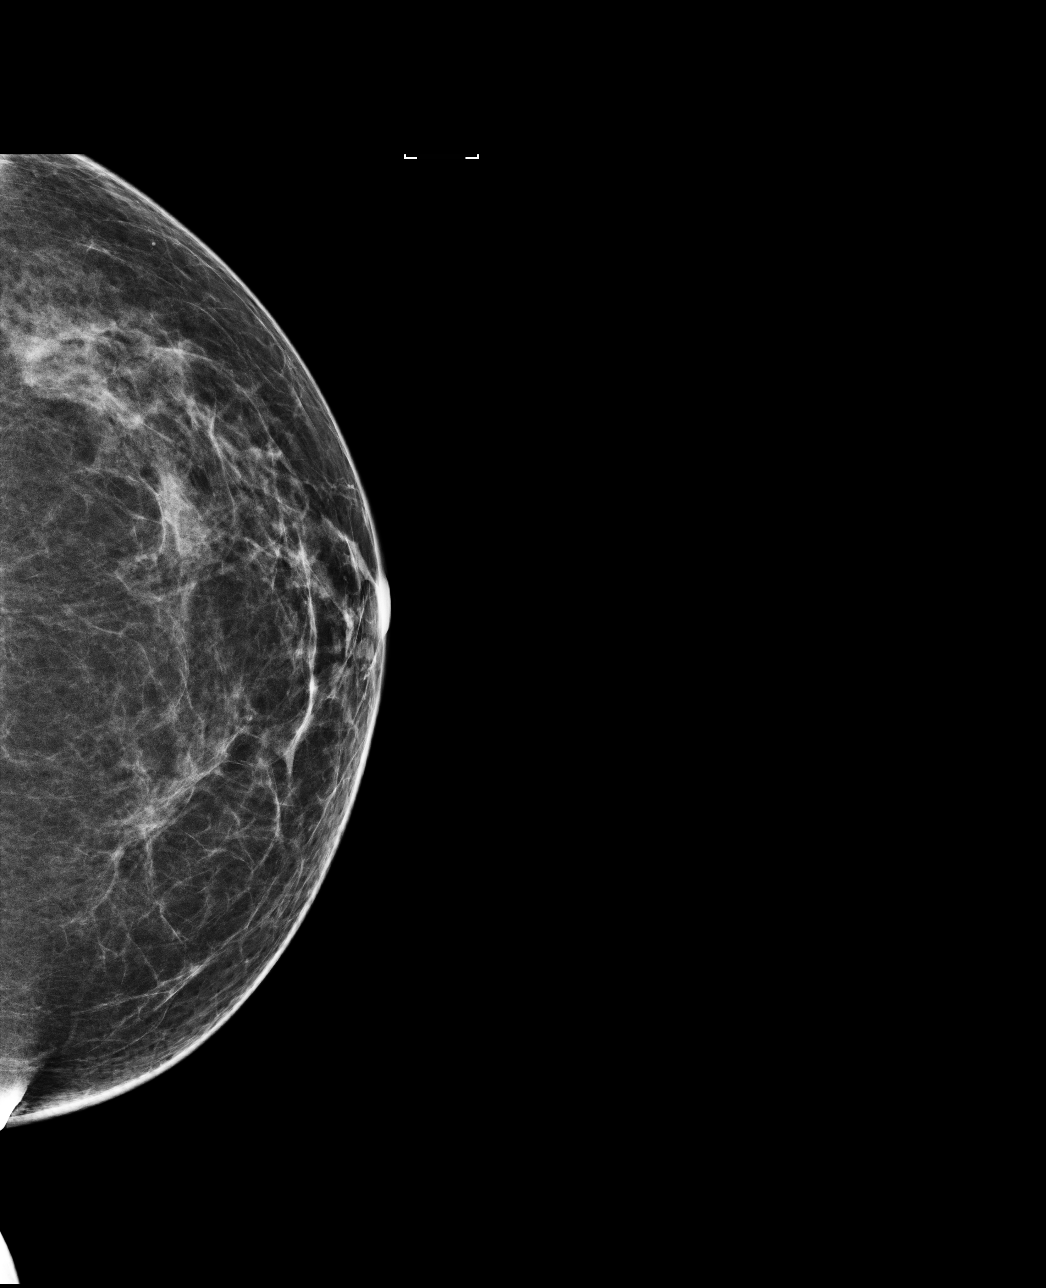

[R MLO]
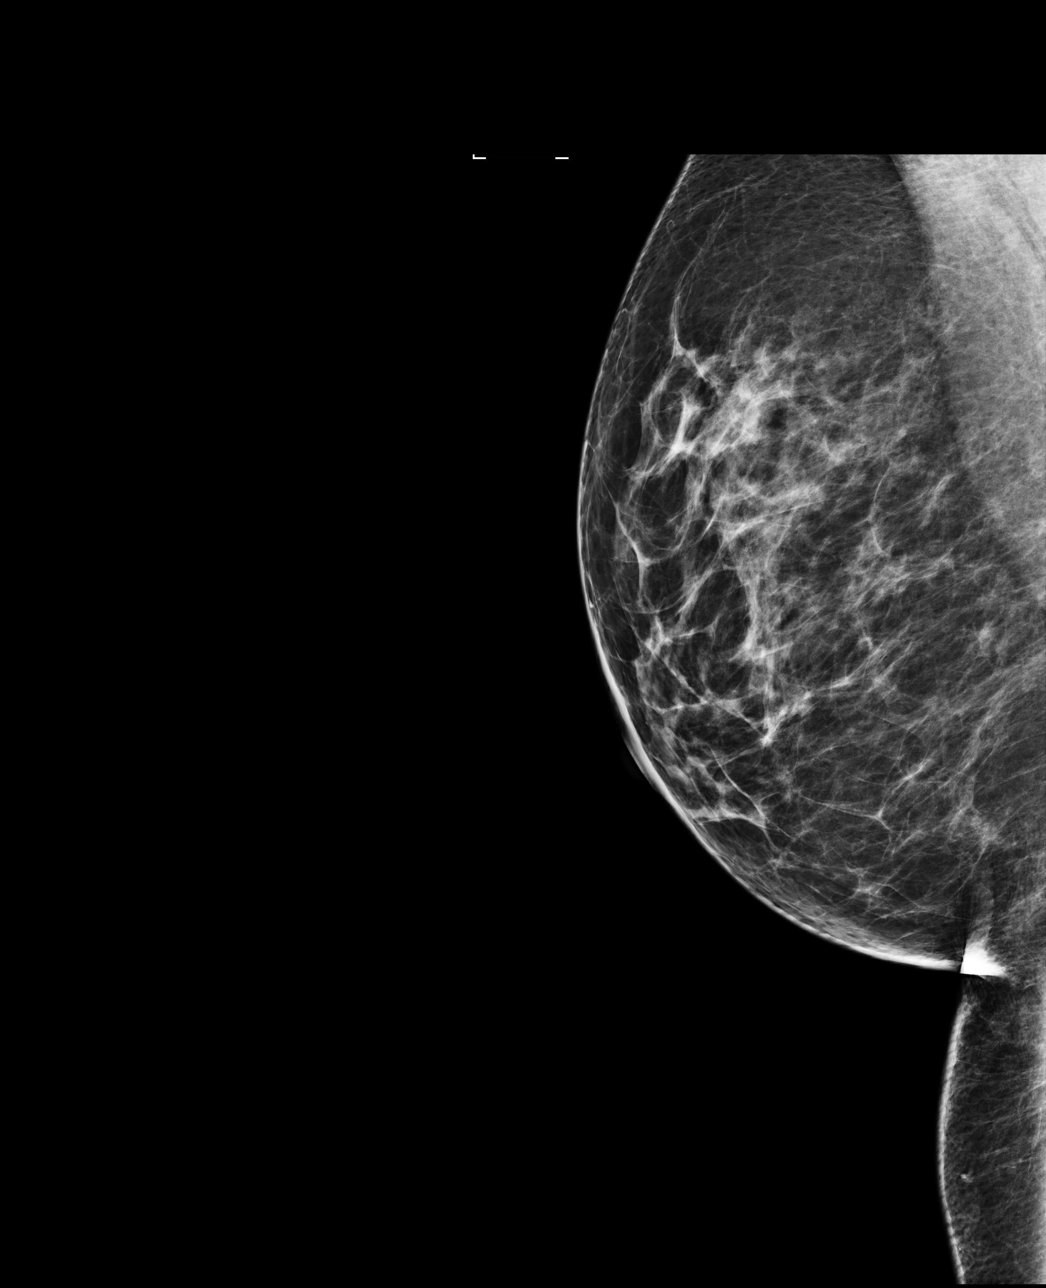

[R CC]
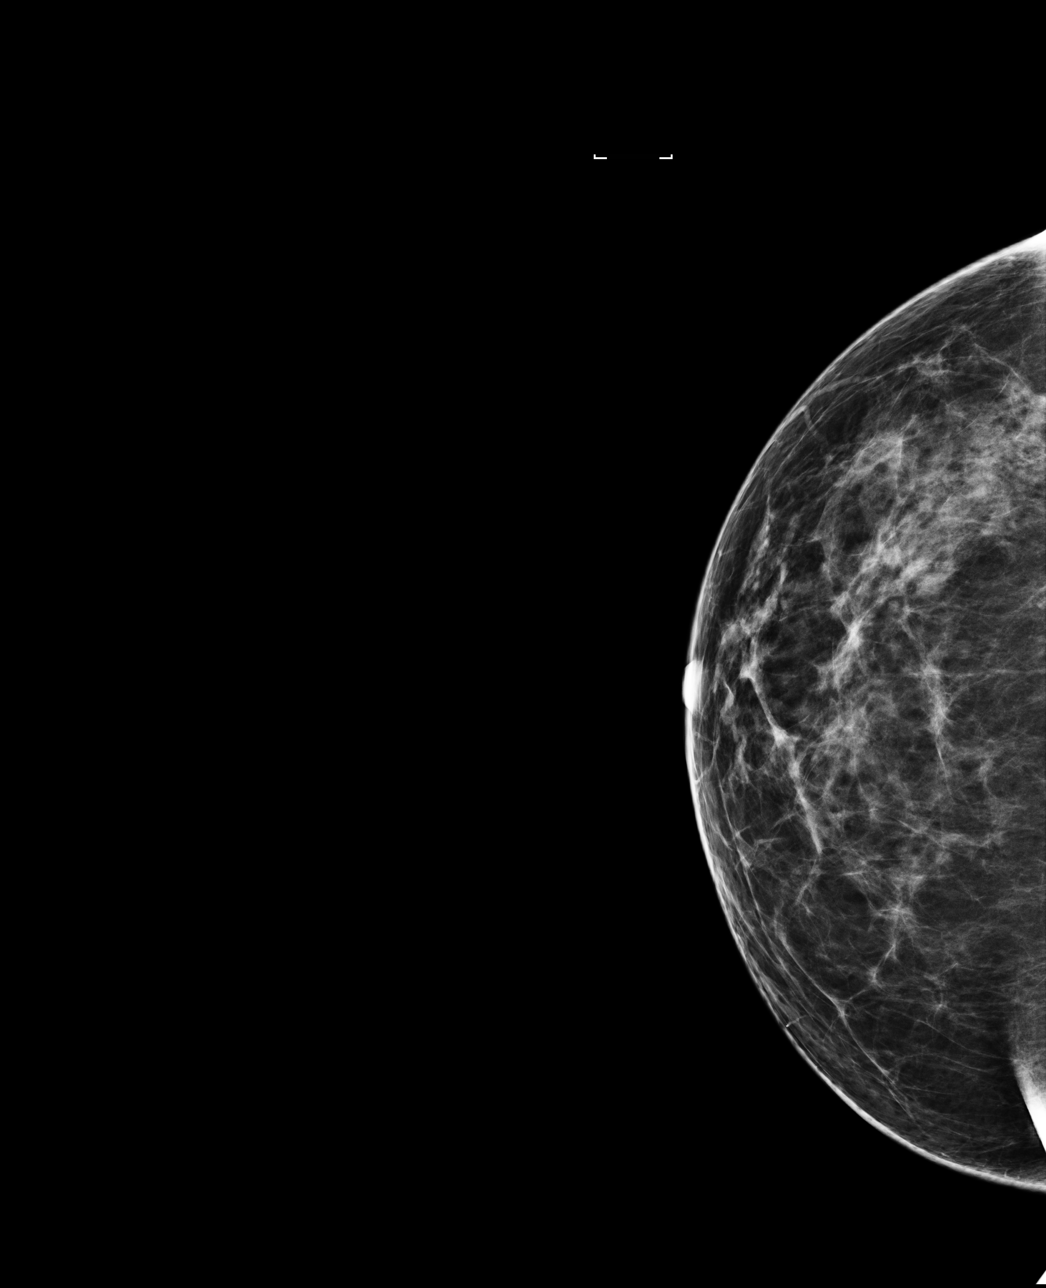

[4 of 4 positions shown; findings below may reference images not displayed]

ACR Breast Density Category b: There are scattered areas of
fibroglandular density.
FINDINGS: There are no findings suspicious for malignancy. Images were
processed with CAD.
IMPRESSION: No mammographic evidence of malignancy. A result letter of this
screening mammogram will be mailed directly to the patient.

RECOMMENDATION:
Screening mammogram in one year. (Code:AS-G-LCT)

BI-RADS CATEGORY  1: Negative.

## 2022-01-24 ENCOUNTER — Telehealth: Payer: Self-pay | Admitting: Physician Assistant

## 2022-01-24 MED ORDER — ATORVASTATIN CALCIUM 10 MG PO TABS: 10.0000 mg | ORAL_TABLET | Freq: Every day | ORAL | 1 refills | Status: DC

## 2022-01-24 NOTE — Telephone Encounter (Signed)
Pt states son picked up latest refill of medication then lost it. States he picked it up approximately two weeks ago.  Pt requests another prescription sent for remaining time until CPE?     LAST APPOINTMENT DATE:   CPE 08/2020  NEXT APPOINTMENT DATE: CPE 02/02/22   MEDICATION: atorvastatin (LIPITOR) 10 MG tablet [[id number]]    Is the patient out of medication?  Yes   PHARMACY: [id number] [id number] Phone:  [phone] Fax:  [phone]

## 2022-02-02 ENCOUNTER — Encounter: Payer: Self-pay | Admitting: Physician Assistant

## 2022-02-02 ENCOUNTER — Other Ambulatory Visit: Payer: Self-pay | Admitting: Physician Assistant

## 2022-02-02 ENCOUNTER — Ambulatory Visit (INDEPENDENT_AMBULATORY_CARE_PROVIDER_SITE_OTHER): Payer: 59 | Admitting: Physician Assistant

## 2022-02-02 VITALS — BP 112/74 | HR 87 | Temp 98.0°F | Ht 68.0 in | Wt 203.2 lb

## 2022-02-02 DIAGNOSIS — G8929 Other chronic pain: Secondary | ICD-10-CM | POA: Diagnosis not present

## 2022-02-02 DIAGNOSIS — K5190 Ulcerative colitis, unspecified, without complications: Secondary | ICD-10-CM | POA: Diagnosis not present

## 2022-02-02 DIAGNOSIS — R1011 Right upper quadrant pain: Secondary | ICD-10-CM | POA: Diagnosis not present

## 2022-02-02 DIAGNOSIS — E669 Obesity, unspecified: Secondary | ICD-10-CM

## 2022-02-02 DIAGNOSIS — Z0001 Encounter for general adult medical examination with abnormal findings: Secondary | ICD-10-CM

## 2022-02-02 DIAGNOSIS — M25512 Pain in left shoulder: Secondary | ICD-10-CM

## 2022-02-02 DIAGNOSIS — E7800 Pure hypercholesterolemia, unspecified: Secondary | ICD-10-CM | POA: Diagnosis not present

## 2022-02-02 DIAGNOSIS — E875 Hyperkalemia: Secondary | ICD-10-CM

## 2022-02-02 LAB — LIPID PANEL
Cholesterol: 176 mg/dL (ref 0–200)
HDL: 47.1 mg/dL (ref >39.00)
LDL Cholesterol: 103 mg/dL — ABNORMAL HIGH (ref 0–99)
NonHDL: 128.67
Total CHOL/HDL Ratio: 4
Triglycerides: 128 mg/dL (ref 0.0–149.0)
VLDL: 25.6 mg/dL (ref 0.0–40.0)

## 2022-02-02 LAB — CBC WITH DIFFERENTIAL/PLATELET
Basophils Absolute: 0 K/uL (ref 0.0–0.1)
Basophils Relative: 0.7 % (ref 0.0–3.0)
Eosinophils Absolute: 0.1 K/uL (ref 0.0–0.7)
Eosinophils Relative: 2.7 % (ref 0.0–5.0)
HCT: 42.9 % (ref 36.0–46.0)
Hemoglobin: 14.2 g/dL (ref 12.0–15.0)
Lymphocytes Relative: 37.3 % (ref 12.0–46.0)
Lymphs Abs: 2 K/uL (ref 0.7–4.0)
MCHC: 33.2 g/dL (ref 30.0–36.0)
MCV: 93.7 fl (ref 78.0–100.0)
Monocytes Absolute: 0.4 K/uL (ref 0.1–1.0)
Monocytes Relative: 7 % (ref 3.0–12.0)
Neutro Abs: 2.8 K/uL (ref 1.4–7.7)
Neutrophils Relative %: 52.3 % (ref 43.0–77.0)
Platelets: 220 K/uL (ref 150.0–400.0)
RBC: 4.58 Mil/uL (ref 3.87–5.11)
RDW: 12.9 % (ref 11.5–15.5)
WBC: 5.3 K/uL (ref 4.0–10.5)

## 2022-02-02 LAB — COMPREHENSIVE METABOLIC PANEL WITH GFR
ALT: 19 U/L (ref 0–35)
AST: 17 U/L (ref 0–37)
Albumin: 4.9 g/dL (ref 3.5–5.2)
Alkaline Phosphatase: 68 U/L (ref 39–117)
BUN: 18 mg/dL (ref 6–23)
CO2: 29 meq/L (ref 19–32)
Calcium: 10.3 mg/dL (ref 8.4–10.5)
Chloride: 108 meq/L (ref 96–112)
Creatinine, Ser: 0.94 mg/dL (ref 0.40–1.20)
GFR: 64.99 mL/min
Glucose, Bld: 108 mg/dL — ABNORMAL HIGH (ref 70–99)
Potassium: 5.7 meq/L — ABNORMAL HIGH (ref 3.5–5.1)
Sodium: 145 meq/L (ref 135–145)
Total Bilirubin: 1.4 mg/dL — ABNORMAL HIGH (ref 0.2–1.2)
Total Protein: 7.7 g/dL (ref 6.0–8.3)

## 2022-02-02 NOTE — Progress Notes (Signed)
Subjective:    Tamara Richardson is a 61 y.o. female and is here for a comprehensive physical exam.   HPI  Health Maintenance Due  Topic Date Due   COLONOSCOPY (Pts 45-70yr Insurance coverage will need to be confirmed)  Never done   COVID-19 Vaccine ([place] - [place] 08/21/2021    Acute Concerns: None   Chronic Issues: Ulcerative Colitis Patient does have hx of this. States this is affected by stress. Has been affected her quality of life. She does admit taking Asacol in the past for this. States this did helped with her symptoms and was doing well.  Has not seen GI recently and she would like to be referred at this time.   HLD She is currently compliant with taking Lipitor 10 mg daily. Overall well controlled. Has been trying to eat healthy diet. Denies any concerning sx.   RUQ Pain  Patient does admit having right upper quadrant pain for a while. States she experience this and nausea after eating foods. Had ultrasound of abdomen on 03/07/2023 which was normal. No gallstones. She would like to get ultrasound of abdomen at this time. Denies any worsening sx.   Left shoulder pain  Patient complain of left shoulder pain that has been going on for a few months. She described this as " pulling sensation". No specific treatment tried. Denies pressure or worsening pain. No radiating up or down. She does have family hx of heart attack in father and heart disease in mother.   Health Maintenance: Immunizations -- UTD Covid-UTD Colonoscopy -- Overdue Mammogram -- UTD PAP -- UTD Bone Density -- N/A Diet -- Balanced diet.  Sleep habits -- Insomnia sometimes.  Exercise -- Try to complete 10,000 steps a day. Goes to gym 2 times a week.  Weight -- 203 Ib (92.2 kg) Mood -- Stable  Weight history: Wt Readings from Last 10 Encounters:  02/02/22 203 lb 4 oz (92.2 kg)  08/02/21 190 lb 14.7 oz (86.6 kg)  09/02/20 191 lb (86.6 kg)  08/07/19 210 lb (95.3 kg)  04/16/18 203  lb (92.1 kg)  04/09/18 204 lb (92.5 kg)   Body mass index is 30.9 kg/m. No LMP recorded. Patient has had a hysterectomy. Alcohol use:  reports current alcohol use of about 1.0 standard drink of alcohol per week. Tobacco use: None  Tobacco Use: Low Risk  (02/02/2022)   Patient History    Smoking Tobacco Use: Never    Smokeless Tobacco Use: Never    Passive Exposure: Not on file        02/02/2022   10:27 AM  Depression screen PHQ 2/9  Decreased Interest 0  Down, Depressed, Hopeless 0  PHQ - 2 Score 0     Other providers/specialists: Patient Care Team: WInda Coke PUtahas PCP - General (Physician Assistant) KGeri Seminole PsyD (Inactive) (Psychology)    PMHx, SurgHx, SocialHx, Medications, and Allergies were reviewed in the Visit Navigator and updated as appropriate.   Past Medical History:  Diagnosis Date   Hyperlipidemia 06/09/2014   genetic, was on statin for about 6 months   [place]cerative colitis [place] 2001   mostly controlled with diet   Vitamin D deficiency      Past Surgical History:  Procedure Laterality Date   ABDOMINAL HYSTERECTOMY  2001   FOOT SURGERY Bilateral    TONSILLECTOMY AND ADENOIDECTOMY     WISDOM TOOTH EXTRACTION       Family History  Problem Relation Age  of Onset   Heart disease Mother    Heart attack Father 85   Hypertension Father    Breast cancer Maternal Aunt 41       died at age 74/ mother's half sister   Colon cancer Neg Hx     Social History   Tobacco Use   Smoking status: Never   Smokeless tobacco: Never  Vaping Use   Vaping Use: Never used  Substance Use Topics   Alcohol use: Yes    Alcohol/week: 1.0 standard drink of alcohol    Types: 1 Glasses of wine per week   Drug use: Never    Review of Systems:   Review of Systems  Constitutional:  Negative for chills, fever, malaise/fatigue and weight loss.  HENT:  Negative for hearing loss, sinus pain and sore throat.   Respiratory:  Negative for cough and  hemoptysis.   Cardiovascular:  Negative for chest pain, palpitations, leg swelling and PND.  Gastrointestinal:  Negative for abdominal pain, constipation, diarrhea, heartburn, nausea and vomiting.  Genitourinary:  Negative for dysuria, frequency and urgency.  Musculoskeletal:  Negative for back pain, myalgias and neck pain.  Skin:  Negative for itching and rash.  Neurological:  Negative for dizziness, tingling, seizures and headaches.  Endo/Heme/Allergies:  Negative for polydipsia.  Psychiatric/Behavioral:  Negative for depression. The patient is not nervous/anxious.      Objective:   BP 112/74 (BP Location: Left Arm, Patient Position: Sitting, Cuff Size: Large)   Pulse 87   Temp 98 F (36.7 C) (Temporal)   Ht '5\' 8"'$  (1.727 m)   Wt 203 lb 4 oz (92.2 kg)   SpO2 97%   BMI 30.90 kg/m  Body mass index is 30.9 kg/m.   General Appearance:    Alert, cooperative, no distress, appears stated age  Head:    Normocephalic, without obvious abnormality, atraumatic  Eyes:    PERRL, conjunctiva/corneas clear, EOM's intact, fundi    benign, both eyes  Ears:    Normal TM's and external ear canals, both ears  Nose:   Nares normal, septum midline, mucosa normal, no drainage    or sinus tenderness  Throat:   Lips, mucosa, and tongue normal; teeth and gums normal  Neck:   Supple, symmetrical, trachea midline, no adenopathy;    thyroid:  no enlargement/tenderness/nodules; no carotid   bruit or JVD  Back:     Symmetric, no curvature, ROM normal, no CVA tenderness  Lungs:     Clear to auscultation bilaterally, respirations unlabored  Chest Wall:    No tenderness or deformity   Heart:    Regular rate and rhythm, S1 and S2 normal, no murmur, rub or gallop  Breast Exam:    No tenderness, masses, or nipple abnormality  Abdomen:     Soft, non-tender, bowel sounds active all four quadrants,    no masses, no organomegaly  Genitalia:    Normal female without lesion, discharge or tenderness  Extremities:    Extremities normal, atraumatic, no cyanosis or edema  Pulses:   2+ and symmetric all extremities  Skin:   Skin color, texture, turgor normal, no rashes or lesions  Lymph nodes:   Cervical, supraclavicular, and axillary nodes normal  Neurologic:   CNII-XII intact, normal strength, sensation and reflexes    throughout    Assessment/Plan:   Encounter for general adult medical examination with abnormal findings Today patient counseled on age appropriate routine health concerns for screening and prevention, each reviewed and up to date or  declined. Immunizations reviewed and up to date or declined. Labs ordered and reviewed. Risk factors for depression reviewed and negative. Hearing function and visual acuity are intact. ADLs screened and addressed as needed. Functional ability and level of safety reviewed and appropriate. Education, counseling and referrals performed based on assessed risks today. Patient provided with a copy of personalized plan for preventive services.  Ulcerative colitis without complications, unspecified location [facility]) Having worsening control Hasn't seen GI in years-- agreeable to referral, also needs colonoscopy  Pure hypercholesterolemia Update lipid panel and adjust lipitor accordingly Also order Calcium Score  Obesity, unspecified classification, unspecified obesity type, unspecified whether serious comorbidity present Continue exercise and eating healthy  RUQ pain Concern for gallbladder issue Obtain u/s for   Chronic left shoulder pain No obvious red flags She would like to try topical voltaren gel and exercises If lack of improvement, will likely refer to PT - - she will let us know    Patient Counseling: '[x]'$    Nutrition: Stressed importance of moderation in sodium/caffeine intake, saturated fat and cholesterol, caloric balance, sufficient intake of fresh fruits, vegetables, fiber, calcium, iron, and 1 mg of folate supplement per day (for females capable of  pregnancy).  '[x]'$    Stressed the importance of regular exercise.   '[x]'$    Substance Abuse: Discussed cessation/primary prevention of tobacco, alcohol, or other drug use; driving or other dangerous activities under the influence; availability of treatment for abuse.   '[x]'$    Injury prevention: Discussed safety belts, safety helmets, smoke detector, smoking near bedding or upholstery.   '[x]'$    Sexuality: Discussed sexually transmitted diseases, partner selection, use of condoms, avoidance of unintended pregnancy  and contraceptive alternatives.  '[x]'$    Dental health: Discussed importance of regular tooth brushing, flossing, and dental visits.  '[x]'$    Health maintenance and immunizations reviewed. Please refer to Health maintenance section.    I,Savera Zaman,acting as a Education administrator for [employer], PA.,have documented all relevant documentation on the behalf of Inda Coke, PA,as directed by  Inda Coke, PA while in the presence of Inda Coke, [place].   I, Inda Coke, [place], have reviewed all documentation for this visit. The documentation on 02/02/22 for the exam, diagnosis, procedures, and orders are all accurate and complete.   Inda Coke, PA-C [place]

## 2022-02-02 NOTE — Patient Instructions (Addendum)
It was great to see you!  -Referral to GI for your ulcerative colitis -Ultrasound of your stomach ordered -CT scan of your heart ordered -Trial voltaren gel and exercises for your shoulder, keep me posted if you would like   Please go to the lab for blood work.   Our office will call you with your results unless you have chosen to receive results via MyChart.  If your blood work is normal we will follow-up each year for physicals and as scheduled for chronic medical problems.  If anything is abnormal we will treat accordingly and get you in for a follow-up.  Take care,  Aldona Bar

## 2022-03-24 ENCOUNTER — Telehealth: Payer: Self-pay | Admitting: Physician Assistant

## 2022-03-24 DIAGNOSIS — G8929 Other chronic pain: Secondary | ICD-10-CM

## 2022-03-24 NOTE — Telephone Encounter (Signed)
Patient states: -She has tried to continue resting her shoulder but is still experiencing discomfort  - She discussed dry needling option with PCP and wanted to know more about trying this option  Would this warrant an OV? Please advise.

## 2022-03-24 NOTE — Telephone Encounter (Signed)
Please see message and advise 

## 2022-03-25 NOTE — Telephone Encounter (Signed)
Spoke to pt told her referral has been placed for PT and someone will contact you to schedule an appointment.

## 2022-03-29 ENCOUNTER — Encounter: Payer: Self-pay | Admitting: Gastroenterology

## 2022-03-30 ENCOUNTER — Ambulatory Visit
Admission: RE | Admit: 2022-03-30 | Discharge: 2022-03-30 | Disposition: A | Discharge status: Home / Self Care | Payer: 59 | Source: Ambulatory Visit | Attending: Physician Assistant | Admitting: Physician Assistant

## 2022-03-30 DIAGNOSIS — R1011 Right upper quadrant pain: Secondary | ICD-10-CM

## 2022-04-08 ENCOUNTER — Ambulatory Visit
Admission: RE | Admit: 2022-04-08 | Discharge: 2022-04-08 | Disposition: A | Discharge status: Home / Self Care | Payer: 59 | Source: Ambulatory Visit | Attending: Physician Assistant | Admitting: Physician Assistant

## 2022-04-08 DIAGNOSIS — E7800 Pure hypercholesterolemia, unspecified: Secondary | ICD-10-CM

## 2022-04-13 NOTE — Therapy (Unsigned)
OUTPATIENT PHYSICAL THERAPY SHOULDER EVALUATION   Patient Name: Tamara Richardson MRN: [id number] DOB:07-24-1961, 61 y.o., female Today's Date: 04/14/2022   PT End of Session - 04/14/22 1313     Visit Number 1    Number of Visits 12    Date for PT Re-Evaluation 07/07/22    PT Start Time 1101    PT Stop Time 1140    PT Time Calculation (min) 39 min             Past Medical History:  Diagnosis Date   Hyperlipidemia 06/09/2014   genetic, was on statin for about 6 months   Ulcerative colitis ([place]) 2001   mostly controlled with diet   Vitamin D deficiency    Past Surgical History:  Procedure Laterality Date   ABDOMINAL HYSTERECTOMY  2001   FOOT SURGERY Bilateral    TONSILLECTOMY AND ADENOIDECTOMY     WISDOM TOOTH EXTRACTION     Patient Active Problem List   Diagnosis Date Noted   Persistent cough 08/02/2021   Hyperlipidemia 08/14/2009   ENDOMETRIOSIS 08/14/2009   MITRAL VALVE PROLAPSE 04/06/2009   Ulcerative colitis ([place]) 04/06/2009   CHEST PAIN, PRECORDIAL 04/06/2009   ADJUSTMENT DISORDER WITH DEPRESSED MOOD 11/20/2008    PCP: Inda Coke, PA   REFERRING PROVIDER: Inda Coke, PA   REFERRING DIAG: [phone] (ICD-10-CM) - Chronic left shoulder pain   THERAPY DIAG:  Acute pain of left shoulder  Muscle weakness (generalized)  Rationale for Evaluation and Treatment Rehabilitation  ONSET DATE: Summer 2023  SUBJECTIVE:                                                                                                                                                                                      SUBJECTIVE STATEMENT: States she was helping her partner cleaning pools this summer and there left UT really spasms. States she then drove and that bothered her. States that when she does something extra it bothers like doing pushups or overhead exercises. States she has tried some massages and it helps for a short while. States that the pain  occasionally goes up in the neck and into the left shoulder. States it does not bother her to golf. Stats   PERTINENT HISTORY: hysterectomy  PAIN:  Are you having pain? Yes: NPRS scale: 0-1/10 Pain location: left UT Pain description: spasms, present Aggravating factors: driving Relieving factors: percussion gun and massage   PRECAUTIONS: None  WEIGHT BEARING RESTRICTIONS No  FALLS:  Has patient fallen in last 6 months? No       PLOF: Independent plays golf and curls  PATIENT GOALS to have less pain  OBJECTIVE:  COGNITION:  Overall cognitive status: Within functional limits for tasks assessed    POSTURE: Rounded shoulders, forward head, increased thoracic kyphosis   Cervical A/PROM:    04/14/2022      Flexion WFL      Extension  20*     R ROT 40**      L ROT  55**     R SB 20**     L SB 20**     * Pain/pulling   **pulling on left side (Blank rows = not tested)    UE Measurements Upper Extremity Right 04/14/2022 Left 04/14/2022   A/PROM MMT A/PROM MMT  Shoulder Flexion 160/165 4+ 155*/160* 4+  Shoulder Extension      Shoulder Abduction 170* 4 170* 4  Shoulder Adduction      Shoulder Internal Rotation Reaches to T12 SP/60 4+ Reaches to L1 SP*/45 4+  Shoulder External Rotation Reaches to C6 SP/90 4 Reaches to C7 SP /90 4  Elbow Flexion      Elbow Extension      Wrist Flexion      Wrist Extension      Wrist Supination      Wrist Pronation      Wrist Ulnar Deviation      Wrist Radial Deviation      Grip Strength NA  NA     (Blank rows = not tested)   * pulling pain   Pt is right handed      JOINT MOBILITY TESTING:  Hypomobility noted throughout cervical and thoracic spine  PALPATION:  Tenderness to palpation along cervical paraspinals. Left RC muscles and UT   TODAY'S TREATMENT:  04/14/2022  Therapeutic Exercise:  Aerobic: Supine: chin tucks, cervical ROT Prone:  Seated:  Standing: Neuromuscular Re-education: Manual  Therapy: Therapeutic Activity: Self Care: Trigger Point Dry Needling:  Modalities:      PATIENT EDUCATION:  Education details: on current presentation, on HEP, on clinical outcomes score and POC Person educated: Patient Education method: Explanation, Demonstration, and Handouts Education comprehension: verbalized understanding    HOME EXERCISE PROGRAM: [id number]  ASSESSMENT:  CLINICAL IMPRESSION: Patient presents with left upper shoulder/neck pain that started earlier this Summer. Patient demonstrates reduced ROM, strength and posture that is likely contributing to current condition. Patient educated in exam findings and POC moving forward. Patient to go out of town for 6 week and provided HEP to work on until she returns. Patient would greatly benefit from skilled PT to improve overall function and QOL.   OBJECTIVE IMPAIRMENTS decreased activity tolerance, decreased ROM, decreased strength, impaired UE functional use, postural dysfunction, and pain.   ACTIVITY LIMITATIONS carrying, lifting, reach over head, and hygiene/grooming  PARTICIPATION LIMITATIONS: meal prep, cleaning, and driving  PERSONAL FACTORS Time since onset of injury/illness/exacerbation are also affecting patient's functional outcome.   REHAB POTENTIAL: Good  CLINICAL DECISION MAKING: Stable/uncomplicated  EVALUATION COMPLEXITY: Low   GOALS: Goals reviewed with patient?  yes  SHORT TERM GOALS:  Patient will be independent in self management strategies to improve quality of life and functional outcomes. Baseline: new program Target date: 05/26/2022 Goal status: INITIAL  2.  Patient will report at least 50% improvement in overall symptoms and/or function to demonstrate improved functional mobility Baseline: 0% Target date: 05/26/2022 Goal status: INITIAL  3.  Patient will be abel to demonstrate at least 60 degrees of cervical ROT while sitting to improve ability to check blind spots while  driving Baseline: see above Target date: 05/26/2022 Goal status: INITIAL  LONG TERM GOALS:  Patient will report at least 75% improvement in overall symptoms and/or function to demonstrate improved functional mobility Baseline: 0% Target date: 07/07/2022 Goal status: INITIAL  2.  Patient will be able to drive without pain to demonstrate improved shoulder mobility/function while driving Baseline: painful Target date: 07/07/2022 Goal status: INITIAL  3.  Patient will be able to demonstrate painfree left shoulder ROM  Baseline: painful Target date: 07/07/2022 Goal status: INITIAL    PLAN: PT FREQUENCY: 1-2x/week for total of 12 visits over 12 week certification (patient going out of town for 6 weeks)  PT DURATION: 12 weeks  PLANNED INTERVENTIONS: Therapeutic exercises, Therapeutic activity, Neuromuscular re-education, Balance training, Gait training, Patient/Family education, Self Care, Joint mobilization, Joint manipulation, Orthotic/Fit training, DME instructions, Aquatic Therapy, Dry Needling, Electrical stimulation, Spinal manipulation, Spinal mobilization, Cryotherapy, Moist heat, Taping, Traction, Ionotophoresis '4mg'$ /ml Dexamethasone, Manual therapy, and Re-evaluation  PLAN FOR NEXT SESSION: posture, DN?, shoulder ROM, thoracic motion, STM/percussion   1:30 PM, 04/14/22 Jerene Pitch, DPT Physical Therapy with Royston Sinner

## 2022-04-14 ENCOUNTER — Ambulatory Visit: Payer: 59 | Admitting: Physical Therapy

## 2022-04-14 ENCOUNTER — Encounter: Payer: Self-pay | Admitting: Physical Therapy

## 2022-04-14 DIAGNOSIS — M6281 Muscle weakness (generalized): Secondary | ICD-10-CM | POA: Diagnosis not present

## 2022-04-14 DIAGNOSIS — M25512 Pain in left shoulder: Secondary | ICD-10-CM

## 2022-04-14 NOTE — Patient Instructions (Signed)

## 2022-04-18 ENCOUNTER — Encounter: Payer: Self-pay | Admitting: *Deleted

## 2022-06-01 DIAGNOSIS — L570 Actinic keratosis: Secondary | ICD-10-CM | POA: Diagnosis not present

## 2022-06-01 DIAGNOSIS — X32XXXS Exposure to sunlight, sequela: Secondary | ICD-10-CM | POA: Diagnosis not present

## 2022-06-01 DIAGNOSIS — E669 Obesity, unspecified: Secondary | ICD-10-CM | POA: Diagnosis not present

## 2022-06-01 DIAGNOSIS — L814 Other melanin hyperpigmentation: Secondary | ICD-10-CM | POA: Diagnosis not present

## 2022-06-01 DIAGNOSIS — Q828 Other specified congenital malformations of skin: Secondary | ICD-10-CM | POA: Diagnosis not present

## 2022-06-01 DIAGNOSIS — L821 Other seborrheic keratosis: Secondary | ICD-10-CM | POA: Diagnosis not present

## 2022-06-01 DIAGNOSIS — D2271 Melanocytic nevi of right lower limb, including hip: Secondary | ICD-10-CM | POA: Diagnosis not present

## 2022-06-01 DIAGNOSIS — L820 Inflamed seborrheic keratosis: Secondary | ICD-10-CM | POA: Diagnosis not present

## 2022-06-01 DIAGNOSIS — D225 Melanocytic nevi of trunk: Secondary | ICD-10-CM | POA: Diagnosis not present

## 2022-06-01 DIAGNOSIS — D2272 Melanocytic nevi of left lower limb, including hip: Secondary | ICD-10-CM | POA: Diagnosis not present

## 2022-06-02 ENCOUNTER — Encounter: Payer: 59 | Admitting: Physical Therapy

## 2022-06-06 ENCOUNTER — Encounter: Payer: Self-pay | Admitting: Gastroenterology

## 2022-06-06 ENCOUNTER — Other Ambulatory Visit (INDEPENDENT_AMBULATORY_CARE_PROVIDER_SITE_OTHER): Payer: 59

## 2022-06-06 ENCOUNTER — Ambulatory Visit (INDEPENDENT_AMBULATORY_CARE_PROVIDER_SITE_OTHER): Payer: 59 | Admitting: Gastroenterology

## 2022-06-06 VITALS — BP 142/86 | HR 84 | Ht 68.0 in | Wt 212.0 lb

## 2022-06-06 DIAGNOSIS — H1013 Acute atopic conjunctivitis, bilateral: Secondary | ICD-10-CM | POA: Diagnosis not present

## 2022-06-06 DIAGNOSIS — K5190 Ulcerative colitis, unspecified, without complications: Secondary | ICD-10-CM

## 2022-06-06 LAB — CBC WITH DIFFERENTIAL/PLATELET
Basophils Absolute: 0 10*3/uL (ref 0.0–0.1)
Basophils Relative: 0.7 % (ref 0.0–3.0)
Eosinophils Absolute: 0.2 10*3/uL (ref 0.0–0.7)
Eosinophils Relative: 3 % (ref 0.0–5.0)
HCT: 41.4 % (ref 36.0–46.0)
Hemoglobin: 13.7 g/dL (ref 12.0–15.0)
Lymphocytes Relative: 36 % (ref 12.0–46.0)
Lymphs Abs: 1.9 10*3/uL (ref 0.7–4.0)
MCHC: 33.1 g/dL (ref 30.0–36.0)
MCV: 92.4 fl (ref 78.0–100.0)
Monocytes Absolute: 0.4 10*3/uL (ref 0.1–1.0)
Monocytes Relative: 7.6 % (ref 3.0–12.0)
Neutro Abs: 2.9 10*3/uL (ref 1.4–7.7)
Neutrophils Relative %: 52.7 % (ref 43.0–77.0)
Platelets: 236 10*3/uL (ref 150.0–400.0)
RBC: 4.48 Mil/uL (ref 3.87–5.11)
RDW: 12.4 % (ref 11.5–15.5)
WBC: 5.4 10*3/uL (ref 4.0–10.5)

## 2022-06-06 LAB — IBC + FERRITIN
Ferritin: 76.2 ng/mL (ref 10.0–291.0)
Iron: 148 ug/dL — ABNORMAL HIGH (ref 42–145)
Saturation Ratios: 46.8 % (ref 20.0–50.0)
TIBC: 316.4 ug/dL (ref 250.0–450.0)
Transferrin: 226 mg/dL (ref 212.0–360.0)

## 2022-06-06 LAB — COMPREHENSIVE METABOLIC PANEL
ALT: 20 U/L (ref 0–35)
AST: 16 U/L (ref 0–37)
Albumin: 4.4 g/dL (ref 3.5–5.2)
Alkaline Phosphatase: 64 U/L (ref 39–117)
BUN: 13 mg/dL (ref 6–23)
CO2: 25 mEq/L (ref 19–32)
Calcium: 9.1 mg/dL (ref 8.4–10.5)
Chloride: 106 mEq/L (ref 96–112)
Creatinine, Ser: 0.87 mg/dL (ref 0.40–1.20)
GFR: 71.14 mL/min (ref >60.00)
Glucose, Bld: 107 mg/dL — ABNORMAL HIGH (ref 70–99)
Potassium: 4.4 mEq/L (ref 3.5–5.1)
Sodium: 140 mEq/L (ref 135–145)
Total Bilirubin: 1.3 mg/dL — ABNORMAL HIGH (ref 0.2–1.2)
Total Protein: 6.8 g/dL (ref 6.0–8.3)

## 2022-06-06 LAB — VITAMIN B12: Vitamin B-12: 270 pg/mL (ref 211–911)

## 2022-06-06 LAB — C-REACTIVE PROTEIN: CRP: <1 mg/dL (ref 0.5–20.0)

## 2022-06-06 LAB — FOLATE: Folate: 19.3 ng/mL (ref >5.9)

## 2022-06-06 LAB — VITAMIN D 25 HYDROXY (VIT D DEFICIENCY, FRACTURES): VITD: 18.78 ng/mL — ABNORMAL LOW (ref 30.00–100.00)

## 2022-06-06 MED ORDER — NA SULFATE-K SULFATE-MG SULF 17.5-3.13-1.6 GM/177ML PO SOLN: 1.0000 | Freq: Once | ORAL | 0 refills | Status: AC

## 2022-06-06 NOTE — Progress Notes (Signed)
HPI : Tamara Richardson is a very pleasant 61 year old female with a history of ulcerative colitis who is referred to [place] by Inda Coke, PA for further evaluation and management of ulcerative colitis.  Patient states that she was diagnosed with ulcerative colitis at age 18.  Her index symptoms included abdominal pain with bloody diarrhea, urgency and incontinence.  She reports having symptoms for many months before getting diagnosed and treated.  She believes she was treated with Asacol for about a year or so before stopping.  She believes that only the distal colon was involved.  She has never been hospitalized for ulcerative colitis.  She does not think she ever needed to take steroids. She was previously followed by Dr. Alice Reichert in [facility], and another doctor who has since retired (cannot remember the name). She has not taken any medications for ulcerative colitis in about 20 years.  She has not had any recurrence of her index symptoms.  She does have bothersome GI symptoms, mostly manifested by irregular bowel habits, alternating between diarrhea with urgency and constipation.  The symptoms have been more bothersome in the past year.  When she has diarrhea, she will have up to 4 bowel movements, often with urgency.  No blood with the loose stools.  Very rarely will she have a nocturnal stool.  This will continue for a few days, then she will have constipation, manifested by no bowel movements for several days.  She take this laxatives (senna or Dulcolax) when she has gone several days without a bowel movement. Abdominal pain is not a significant problem for her.  Rarely she will have some abdominal discomfort, but no severe crampy pain. When she is constipated, she will sometimes have bright red blood per rectum.  She has perianal itching sometimes which responds to Tucks pads. No family history of colon cancer.  No known family history of inflammatory bowel disease. Patient states that her last  colonoscopy was a long time ago (over 10 years).  She does not believe she has ever had any polyps.  She does not think that there is any inflammation at the time of her last colonoscopy.   Past Medical History:  Diagnosis Date   Hyperlipidemia 06/09/2014   genetic, was on statin for about 6 months   Ulcerative colitis ([place]) 2001   mostly controlled with diet   Vitamin D deficiency      Past Surgical History:  Procedure Laterality Date   ABDOMINAL HYSTERECTOMY  2001   FOOT SURGERY Bilateral    TONSILLECTOMY AND ADENOIDECTOMY     WISDOM TOOTH EXTRACTION     Family History  Problem Relation Age of Onset   Heart disease Mother    Heart attack Father 5   Hypertension Father    Breast cancer Maternal Aunt 29       died at age 16/ mother's half sister   Colon cancer Neg Hx    Social History   Tobacco Use   Smoking status: Never   Smokeless tobacco: Never  Vaping Use   Vaping Use: Never used  Substance Use Topics   Alcohol use: Yes    Alcohol/week: 1.0 standard drink of alcohol    Types: 1 Glasses of wine per week   Drug use: Never   Current Outpatient Medications  Medication Sig Dispense Refill   albuterol (VENTOLIN HFA) 108 (90 Base) MCG/ACT inhaler Inhale 2 puffs into the lungs every 6 (six) hours as needed for wheezing or shortness of breath. 8  g 0   atorvastatin (LIPITOR) 10 MG tablet Take 1 tablet (10 mg total) by mouth daily. 90 tablet 1   naproxen sodium (ALEVE) 220 MG tablet Take by mouth as needed.     No current facility-administered medications for this visit.   Allergies  Allergen Reactions   Codeine    Uncaria Tomentosa (Cats Claw)     Severe allergy to cats     Review of Systems: All systems reviewed and negative except where noted in HPI.    No results found.  Physical Exam: BP (!) 142/86   Pulse 84   Ht '5\' 8"'$  (1.727 m)   Wt 212 lb (96.2 kg)   BMI 32.23 kg/m  Constitutional: Pleasant,well-developed, Caucasian female in no acute  distress. HEENT: Normocephalic and atraumatic. Conjunctivae are normal. No scleral icterus. Neck supple.  Cardiovascular: Normal rate, regular rhythm.  Pulmonary/chest: Effort normal and breath sounds normal. No wheezing, rales or rhonchi. Abdominal: Soft, nondistended, mild tenderness to palpation in the periumbilical region, no rigidity or guarding bowel sounds active throughout. There are no masses palpable. No hepatomegaly. Extremities: no edema Neurological: Alert and oriented to person place and time. Skin: Skin is warm and dry. No rashes noted. Psychiatric: Normal mood and affect. Behavior is normal.  CBC    Component Value Date/Time   WBC 5.3 02/02/2022 1109   RBC 4.58 02/02/2022 1109   HGB 14.2 02/02/2022 1109   HCT 42.9 02/02/2022 1109   PLT 220.0 02/02/2022 1109   MCV 93.7 02/02/2022 1109   MCHC 33.2 02/02/2022 1109   RDW 12.9 02/02/2022 1109   LYMPHSABS 2.0 02/02/2022 1109   MONOABS 0.4 02/02/2022 1109   EOSABS 0.1 02/02/2022 1109   BASOSABS 0.0 02/02/2022 1109    CMP     Component Value Date/Time   NA 145 02/02/2022 1109   K 5.7 No hemolysis seen (H) 02/02/2022 1109   CL 108 02/02/2022 1109   CO2 29 02/02/2022 1109   GLUCOSE 108 (H) 02/02/2022 1109   BUN 18 02/02/2022 1109   CREATININE 0.94 02/02/2022 1109   CALCIUM 10.3 02/02/2022 1109   PROT 7.7 02/02/2022 1109   ALBUMIN 4.9 02/02/2022 1109   AST 17 02/02/2022 1109   ALT 19 02/02/2022 1109   ALKPHOS 68 02/02/2022 1109   BILITOT 1.4 (H) 02/02/2022 1109     ASSESSMENT AND PLAN: 61 year old female with ulcerative colitis diagnosed 23 years ago, treated with Asacol, off all medications for over 20 years.  She has chronic symptoms which seem more consistent with IBS rather than active ulcerative colitis.  She has not had a colonoscopy in over 10 years.  We will request records from her previous gastroenterologist to clarify the disease extent and severity at time of diagnosis to help guide dysplasia  surveillance recommendations. We will get baseline labs (CBC, CMP, CRP) and vitamin levels (vitamin D, iron, B12) I recommended she start taking a daily fiber supplement such as Metamucil to improve her stool regularity.  She can continue to take senna or Dulcolax as needed for when she has not had a bowel movement in a few days. If she has active disease on her colonoscopy, we will plan to restart her mesalamine agent. Given the very mild severity of her disease, it is unclear whether she needs to undergo intensive dysplasia surveillance that would typically be recommended.  Ulcerative colitis - Colonoscopy to assess disease activity and dysplasia surveillance - CBC, CMP, CRP - Vitamin D, iron, B12  Irregular bowel habits (alternating  constipation, diarrhea) -Metamucil daily  The details, risks (including bleeding, perforation, infection, missed lesions, medication reactions and possible hospitalization or surgery if complications occur), benefits, and alternatives to colonoscopy with possible biopsy and possible polypectomy were discussed with the patient and she consents to proceed.   Casimira Sutphin E. Candis Schatz, MD [facility]   Inda Coke, [place]

## 2022-06-06 NOTE — Therapy (Signed)
OUTPATIENT PHYSICAL THERAPY TREATMENT NOTE   Patient Name: Tamara Richardson MRN: [id number] DOB:1960-07-26, 61 y.o., female Today's Date: 06/07/2022   PCP: Inda Coke, PA     REFERRING PROVIDER: Inda Coke, PA  END OF SESSION:   PT End of Session - 06/07/22 0800     Visit Number 2    Number of Visits 12    Date for PT Re-Evaluation 07/07/22    PT Start Time 0804    PT Stop Time 0845    PT Time Calculation (min) 41 min             Past Medical History:  Diagnosis Date   Hyperlipidemia 06/09/2014   genetic, was on statin for about 6 months   Ulcerative colitis ([place]) 2001   mostly controlled with diet   Vitamin D deficiency    Past Surgical History:  Procedure Laterality Date   ABDOMINAL HYSTERECTOMY  2001   FOOT SURGERY Bilateral    TONSILLECTOMY AND ADENOIDECTOMY     WISDOM TOOTH EXTRACTION     Patient Active Problem List   Diagnosis Date Noted   Persistent cough 08/02/2021   Hyperlipidemia 08/14/2009   ENDOMETRIOSIS 08/14/2009   MITRAL VALVE PROLAPSE 04/06/2009   Ulcerative colitis ([place]) 04/06/2009   CHEST PAIN, PRECORDIAL 04/06/2009   ADJUSTMENT DISORDER WITH DEPRESSED MOOD 11/20/2008     REFERRING PROVIDER: Inda Coke, PA     REFERRING DIAG: [phone] (ICD-10-CM) - Chronic left shoulder pain     THERAPY DIAG:  Acute pain of left shoulder   Muscle weakness (generalized)   Rationale for Evaluation and Treatment Rehabilitation   ONSET DATE: Summer 2023   SUBJECTIVE:                                                                                                                                                                                       SUBJECTIVE STATEMENT: 06/07/2022 States she feels she has more mobility. States she drove back down from [place] and she  now has increased pain and tightness in the left upper back and that hasn't been present since prior to her last session. Just turning neck to the side  talking to her sister locks up.   Eval:States she was helping her partner cleaning pools this summer and there left UT really spasms. States she then drove and that bothered her. States that when she does something extra it bothers like doing pushups or overhead exercises. States she has tried some massages and it helps for a short while. States that the pain occasionally goes up in the neck and into the left shoulder. States it does not bother her to golf.  PERTINENT HISTORY: hysterectomy   PAIN:  Are you having pain? Yes: NPRS scale: 2/10 Pain location: left UT Pain description: spasms, present Aggravating factors: driving Relieving factors: percussion gun and massage    PRECAUTIONS: None   WEIGHT BEARING RESTRICTIONS No   FALLS:  Has patient fallen in last 6 months? No         PLOF: Independent plays golf and curls   PATIENT GOALS to have less pain   OBJECTIVE:     COGNITION:           Overall cognitive status: Within functional limits for tasks assessed     POSTURE: Rounded shoulders, forward head, increased thoracic kyphosis              Cervical A/PROM:    04/14/2022      Flexion WFL      Extension  20*     R ROT 40**      L ROT  55**     R SB 20**     L SB 20**                          * Pain/pulling                       **pulling on left side (Blank rows = not tested)                UE Measurements       Upper Extremity Right 04/14/2022 Left 04/14/2022    A/PROM MMT A/PROM MMT  Shoulder Flexion 160/165 4+ 155*/160* 4+  Shoulder Extension          Shoulder Abduction 170* 4 170* 4  Shoulder Adduction          Shoulder Internal Rotation Reaches to T12 SP/60 4+ Reaches to L1 SP*/45 4+  Shoulder External Rotation Reaches to C6 SP/90 4 Reaches to C7 SP /90 4  Elbow Flexion          Elbow Extension          Wrist Flexion          Wrist Extension          Wrist Supination          Wrist Pronation          Wrist Ulnar Deviation          Wrist  Radial Deviation          Grip Strength NA   NA                          (Blank rows = not tested)                       * pulling pain                       Pt is right handed        JOINT MOBILITY TESTING:  Hypomobility noted throughout cervical and thoracic spine   PALPATION:  Tenderness to palpation along cervical paraspinals. Left RC muscles and UT             TODAY'S TREATMENT:  06/07/2022    Therapeutic Exercise:            Aerobic: Supine: shoulder flexion with post tilt x15 5" holds with dowel, scapular protraction with dowel 2x10  5" holds B, chin tucks, cervical ROT with hands (AAROM) 6 minutes Prone:            Seated:            Standing: Neuromuscular Re-education:body scan in supine 6 minutes Manual Therapy: STM to left UT and pec - tolerated well  Therapeutic Activity: Self Care: Trigger Point Dry Needling:  Modalities: thermotherapy to cervical spine and left UT in supine during supine exercises         PATIENT EDUCATION:  Education details: HEP, posture and car posture with towel roll support, on how to perform a body scan Person educated: Patient Education method: Explanation, Demonstration, and Handouts Education comprehension: verbalized understanding       HOME EXERCISE PROGRAM: [id number]   ASSESSMENT:   CLINICAL IMPRESSION: 06/07/2022 Session focused on progression of exercises and educating patient in posture and rationale of interventions. Tolerated heat well. Increased awareness of left shoulder end of session and soreness at base of neck but reduced soreness in left UT. Educated and practiced performing body scan while in supine. Instructed patient on more body awareness.   Eval: Patient presents with left upper shoulder/neck pain that started earlier this Summer. Patient demonstrates reduced ROM, strength and posture that is likely contributing to current condition. Patient educated in exam findings and POC moving forward. Patient to go out  of town for 6 week and provided HEP to work on until she returns. Patient would greatly benefit from skilled PT to improve overall function and QOL.     OBJECTIVE IMPAIRMENTS decreased activity tolerance, decreased ROM, decreased strength, impaired UE functional use, postural dysfunction, and pain.    ACTIVITY LIMITATIONS carrying, lifting, reach over head, and hygiene/grooming   PARTICIPATION LIMITATIONS: meal prep, cleaning, and driving   PERSONAL FACTORS Time since onset of injury/illness/exacerbation are also affecting patient's functional outcome.    REHAB POTENTIAL: Good   CLINICAL DECISION MAKING: Stable/uncomplicated   EVALUATION COMPLEXITY: Low     GOALS: Goals reviewed with patient?  yes   SHORT TERM GOALS:   Patient will be independent in self management strategies to improve quality of life and functional outcomes. Baseline: new program Target date: 05/26/2022 Goal status: INITIAL   2.  Patient will report at least 50% improvement in overall symptoms and/or function to demonstrate improved functional mobility Baseline: 0% Target date: 05/26/2022 Goal status: INITIAL   3.  Patient will be abel to demonstrate at least 60 degrees of cervical ROT while sitting to improve ability to check blind spots while driving Baseline: see above Target date: 05/26/2022 Goal status: INITIAL           LONG TERM GOALS:   Patient will report at least 75% improvement in overall symptoms and/or function to demonstrate improved functional mobility Baseline: 0% Target date: 07/07/2022 Goal status: INITIAL   2.  Patient will be able to drive without pain to demonstrate improved shoulder mobility/function while driving Baseline: painful Target date: 07/07/2022 Goal status: INITIAL   3.  Patient will be able to demonstrate painfree left shoulder ROM  Baseline: painful Target date: 07/07/2022 Goal status: INITIAL       PLAN: PT FREQUENCY: 1-2x/week for total of 12 visits  over 12 week certification (patient going out of town for 6 weeks)   PT DURATION: 12 weeks   PLANNED INTERVENTIONS: Therapeutic exercises, Therapeutic activity, Neuromuscular re-education, Balance training, Gait training, Patient/Family education, Self Care, Joint mobilization, Joint manipulation, Orthotic/Fit training, DME instructions, Aquatic Therapy, Dry Needling, Electrical stimulation,  Spinal manipulation, Spinal mobilization, Cryotherapy, Moist heat, Taping, Traction, Ionotophoresis '4mg'$ /ml Dexamethasone, Manual therapy, and Re-evaluation   PLAN FOR NEXT SESSION: posture, DN?, shoulder ROM, thoracic motion, STM/percussion  8:46 AM, 06/07/22 Jerene Pitch, DPT Physical Therapy with Royston Sinner

## 2022-06-06 NOTE — Patient Instructions (Signed)
If you are age 63 or older, your body mass index should be between 23-30. Your Body mass index is 32.23 kg/m. If this is out of the aforementioned range listed, please consider follow up with your Primary Care Provider.  If you are age 46 or younger, your body mass index should be between 19-25. Your Body mass index is 32.23 kg/m. If this is out of the aformentioned range listed, please consider follow up with your Primary Care Provider.   You have been scheduled for a colonoscopy. Please follow written instructions given to you at your visit today.  Please pick up your prep supplies at the pharmacy within the next 1-3 days. If you use inhalers (even only as needed), please bring them with you on the day of your procedure.  Your provider has requested that you go to the basement level for lab work before leaving today. Press "B" on the elevator. The lab is located at the first door on the left as you exit the elevator.  Please purchase Metamucil over the counter. Take as directed.   The [place] [facility] providers would like to encourage you to use [facility] to communicate with providers for non-urgent requests or questions.  Due to long hold times on the telephone, sending your provider a message by [name] may be a faster and more efficient way to get a response.  Please allow 48 business hours for a response.  Please remember that this is for non-urgent requests.   It was a pleasure to see you today!  Thank you for trusting me with your gastrointestinal care!

## 2022-06-07 ENCOUNTER — Ambulatory Visit: Payer: 59 | Admitting: Physical Therapy

## 2022-06-07 ENCOUNTER — Encounter: Payer: Self-pay | Admitting: Physical Therapy

## 2022-06-07 DIAGNOSIS — M25512 Pain in left shoulder: Secondary | ICD-10-CM

## 2022-06-07 DIAGNOSIS — M6281 Muscle weakness (generalized): Secondary | ICD-10-CM | POA: Diagnosis not present

## 2022-06-09 ENCOUNTER — Ambulatory Visit: Payer: 59 | Admitting: Physical Therapy

## 2022-06-09 ENCOUNTER — Encounter: Payer: Self-pay | Admitting: Physical Therapy

## 2022-06-09 DIAGNOSIS — M6281 Muscle weakness (generalized): Secondary | ICD-10-CM

## 2022-06-09 DIAGNOSIS — M25512 Pain in left shoulder: Secondary | ICD-10-CM | POA: Diagnosis not present

## 2022-06-09 NOTE — Progress Notes (Signed)
Tamara Richardson,  Your labs looked very good overall.  Your vitamin D level was a little low and we should give you some supplements to get this within normal range.  Please take ergocalciferol once a week for 12 weeks, and we will plan to recheck your vitamin D levels after that.  Tamara Richardson,  Can you place a script for ergocalciferol 50,000 U qweek for 12 weeks? #12 [id number] and a lab order for 25-OH level in 12 weeks?  Thanks

## 2022-06-09 NOTE — Therapy (Signed)
OUTPATIENT PHYSICAL THERAPY TREATMENT NOTE   Patient Name: Tamara Richardson MRN: [id number] DOB:03-Aug-1960, 61 y.o., female Today's Date: 06/09/2022   PCP: Inda Coke, PA     REFERRING PROVIDER: Inda Coke, PA  END OF SESSION:   PT End of Session - 06/09/22 0800     Visit Number 3    Number of Visits 12    Date for PT Re-Evaluation 07/07/22    PT Start Time 0802    PT Stop Time 8295    PT Time Calculation (min) 42 min             Past Medical History:  Diagnosis Date   Hyperlipidemia 06/09/2014   genetic, was on statin for about 6 months   Ulcerative colitis ([place]) 2001   mostly controlled with diet   Vitamin D deficiency    Past Surgical History:  Procedure Laterality Date   ABDOMINAL HYSTERECTOMY  2001   FOOT SURGERY Bilateral    TONSILLECTOMY AND ADENOIDECTOMY     WISDOM TOOTH EXTRACTION     Patient Active Problem List   Diagnosis Date Noted   Persistent cough 08/02/2021   Hyperlipidemia 08/14/2009   ENDOMETRIOSIS 08/14/2009   MITRAL VALVE PROLAPSE 04/06/2009   Ulcerative colitis ([place]) 04/06/2009   CHEST PAIN, PRECORDIAL 04/06/2009   ADJUSTMENT DISORDER WITH DEPRESSED MOOD 11/20/2008     REFERRING PROVIDER: Inda Coke, PA     REFERRING DIAG: [phone] (ICD-10-CM) - Chronic left shoulder pain     THERAPY DIAG:  Acute pain of left shoulder   Muscle weakness (generalized)   Rationale for Evaluation and Treatment Rehabilitation   ONSET DATE: Summer 2023   SUBJECTIVE:                                                                                                                                                                                       SUBJECTIVE STATEMENT: 06/09/2022 States that she got a cramp int he shoulder on the left side.   Eval:States she was helping her partner cleaning pools this summer and there left UT really spasms. States she then drove and that bothered her. States that when she does something  extra it bothers like doing pushups or overhead exercises. States she has tried some massages and it helps for a short while. States that the pain occasionally goes up in the neck and into the left shoulder. States it does not bother her to golf.    PERTINENT HISTORY: hysterectomy   PAIN:  Are you having pain? Yes: NPRS scale: 0-2/10 Pain location: left UT Pain description:  tension Aggravating factors: driving Relieving factors: percussion gun and massage    PRECAUTIONS:  None   WEIGHT BEARING RESTRICTIONS No   FALLS:  Has patient fallen in last 6 months? No         PLOF: Independent plays golf and curls   PATIENT GOALS to have less pain   OBJECTIVE:     COGNITION:           Overall cognitive status: Within functional limits for tasks assessed     POSTURE: Rounded shoulders, forward head, increased thoracic kyphosis              Cervical A/PROM:    04/14/2022      Flexion WFL      Extension  20*     R ROT 40**      L ROT  55**     R SB 20**     L SB 20**                          * Pain/pulling                       **pulling on left side (Blank rows = not tested)                UE Measurements       Upper Extremity Right 04/14/2022 Left 04/14/2022    A/PROM MMT A/PROM MMT  Shoulder Flexion 160/165 4+ 155*/160* 4+  Shoulder Extension          Shoulder Abduction 170* 4 170* 4  Shoulder Adduction          Shoulder Internal Rotation Reaches to T12 SP/60 4+ Reaches to L1 SP*/45 4+  Shoulder External Rotation Reaches to C6 SP/90 4 Reaches to C7 SP /90 4  Elbow Flexion          Elbow Extension          Wrist Flexion          Wrist Extension          Wrist Supination          Wrist Pronation          Wrist Ulnar Deviation          Wrist Radial Deviation          Grip Strength NA   NA                          (Blank rows = not tested)                       * pulling pain                       Pt is right handed        JOINT MOBILITY TESTING:   Hypomobility noted throughout cervical and thoracic spine   PALPATION:  Tenderness to palpation along cervical paraspinals. Left RC muscles and UT             TODAY'S TREATMENT:  06/09/2022    Therapeutic Exercise:            Aerobic: Supine: self mobilization with tennis balls and wedge to cervical spine 6 minutes total, chin tuck with assist x25 5" holds, neck ROT x25 5" holds Prone:            Seated:            Standing: self  mobilization to left trap with tennis ball at wall 4 minutes, shoulder flexion up wall with pillow case 2x5 5"holds Neuromuscular Re-education:body scan in supine 6 minutes Manual Therapy: STM to left cervical paraspinals and suboccipitals- tolerated well, grade II for left c spine medial glide - tolerated well  Therapeutic Activity: Self Care: Trigger Point Dry Needling:  Modalities:          PATIENT EDUCATION:  Education details: HEP, posture, rationale for interventions Person educated: Patient Education method: Explanation, Demonstration, and Handouts Education comprehension: verbalized understanding       HOME EXERCISE PROGRAM: [id number]   ASSESSMENT:   CLINICAL IMPRESSION: 06/09/2022 Session focused on mobility of cervical spine. Tolerated manual techniques best, educated patient in self mobilization techniques secondary to going out of town for another month. Focused on education and focus of each intervention. Reduced pain noted end of session. Will continue with current POC as tolerated by patient.  Eval: Patient presents with left upper shoulder/neck pain that started earlier this Summer. Patient demonstrates reduced ROM, strength and posture that is likely contributing to current condition. Patient educated in exam findings and POC moving forward. Patient to go out of town for 6 week and provided HEP to work on until she returns. Patient would greatly benefit from skilled PT to improve overall function and QOL.     OBJECTIVE  IMPAIRMENTS decreased activity tolerance, decreased ROM, decreased strength, impaired UE functional use, postural dysfunction, and pain.    ACTIVITY LIMITATIONS carrying, lifting, reach over head, and hygiene/grooming   PARTICIPATION LIMITATIONS: meal prep, cleaning, and driving   PERSONAL FACTORS Time since onset of injury/illness/exacerbation are also affecting patient's functional outcome.    REHAB POTENTIAL: Good   CLINICAL DECISION MAKING: Stable/uncomplicated   EVALUATION COMPLEXITY: Low     GOALS: Goals reviewed with patient?  yes   SHORT TERM GOALS:   Patient will be independent in self management strategies to improve quality of life and functional outcomes. Baseline: new program Target date: 05/26/2022 Goal status: INITIAL   2.  Patient will report at least 50% improvement in overall symptoms and/or function to demonstrate improved functional mobility Baseline: 0% Target date: 05/26/2022 Goal status: INITIAL   3.  Patient will be abel to demonstrate at least 60 degrees of cervical ROT while sitting to improve ability to check blind spots while driving Baseline: see above Target date: 05/26/2022 Goal status: INITIAL           LONG TERM GOALS:   Patient will report at least 75% improvement in overall symptoms and/or function to demonstrate improved functional mobility Baseline: 0% Target date: 07/07/2022 Goal status: INITIAL   2.  Patient will be able to drive without pain to demonstrate improved shoulder mobility/function while driving Baseline: painful Target date: 07/07/2022 Goal status: INITIAL   3.  Patient will be able to demonstrate painfree left shoulder ROM  Baseline: painful Target date: 07/07/2022 Goal status: INITIAL       PLAN: PT FREQUENCY: 1-2x/week for total of 12 visits over 12 week certification (patient going out of town for 6 weeks)   PT DURATION: 12 weeks   PLANNED INTERVENTIONS: Therapeutic exercises, Therapeutic activity,  Neuromuscular re-education, Balance training, Gait training, Patient/Family education, Self Care, Joint mobilization, Joint manipulation, Orthotic/Fit training, DME instructions, Aquatic Therapy, Dry Needling, Electrical stimulation, Spinal manipulation, Spinal mobilization, Cryotherapy, Moist heat, Taping, Traction, Ionotophoresis '4mg'$ /ml Dexamethasone, Manual therapy, and Re-evaluation   PLAN FOR NEXT SESSION: lat stretch/low back mobility., posture, DN?, shoulder ROM, thoracic motion, STM/percussion  10:04 AM, 06/09/22 Jerene Pitch, DPT Physical Therapy with [facility]

## 2022-06-10 ENCOUNTER — Other Ambulatory Visit: Payer: Self-pay

## 2022-06-10 DIAGNOSIS — K5190 Ulcerative colitis, unspecified, without complications: Secondary | ICD-10-CM

## 2022-06-10 MED ORDER — VITAMIN D (ERGOCALCIFEROL) 1.25 MG (50000 UNIT) PO CAPS: 50000.0000 [IU] | ORAL_CAPSULE | ORAL | 0 refills | Status: DC

## 2022-06-28 ENCOUNTER — Encounter: Payer: 59 | Admitting: Physical Therapy

## 2022-06-30 ENCOUNTER — Encounter: Payer: 59 | Admitting: Physical Therapy

## 2022-07-07 ENCOUNTER — Encounter: Payer: Self-pay | Admitting: *Deleted

## 2022-07-11 ENCOUNTER — Encounter: Payer: Self-pay | Admitting: Gastroenterology

## 2022-07-12 ENCOUNTER — Encounter: Payer: 59 | Admitting: Physical Therapy

## 2022-07-14 ENCOUNTER — Ambulatory Visit: Payer: 59 | Admitting: Physical Therapy

## 2022-07-14 DIAGNOSIS — M6281 Muscle weakness (generalized): Secondary | ICD-10-CM | POA: Diagnosis not present

## 2022-07-14 DIAGNOSIS — M25512 Pain in left shoulder: Secondary | ICD-10-CM

## 2022-07-14 NOTE — Therapy (Signed)
OUTPATIENT PHYSICAL THERAPY TREATMENT NOTE/Re-Cert/ Discharge   Patient Name: Tamara Richardson. Swarm MRN: [id number] DOB:1961/06/12, 61 y.o., female Today's Date: 43/15/4008  Re-Cert today for date extension only.    PCP: Inda Coke, PA     REFERRING PROVIDER: Inda Coke, PA  END OF SESSION:   PT End of Session - 07/19/22 0952     Visit Number 4    Number of Visits 12    Date for PT Re-Evaluation 07/14/22    PT Start Time 0933    PT Stop Time 1016    PT Time Calculation (min) 43 min    Activity Tolerance Patient tolerated treatment well    Behavior During Therapy [facility] for tasks assessed/performed              Past Medical History:  Diagnosis Date   Hyperlipidemia 06/09/2014   genetic, was on statin for about 6 months   Ulcerative colitis ([place]) 2001   mostly controlled with diet   Vitamin D deficiency    Past Surgical History:  Procedure Laterality Date   ABDOMINAL HYSTERECTOMY  2001   FOOT SURGERY Bilateral    TONSILLECTOMY AND ADENOIDECTOMY     WISDOM TOOTH EXTRACTION     Patient Active Problem List   Diagnosis Date Noted   Persistent cough 08/02/2021   Hyperlipidemia 08/14/2009   ENDOMETRIOSIS 08/14/2009   MITRAL VALVE PROLAPSE 04/06/2009   Ulcerative colitis ([place]) 04/06/2009   CHEST PAIN, PRECORDIAL 04/06/2009   ADJUSTMENT DISORDER WITH DEPRESSED MOOD 11/20/2008     REFERRING PROVIDER: Inda Coke, PA     REFERRING DIAG: [phone] (ICD-10-CM) - Chronic left shoulder pain     THERAPY DIAG:  Acute pain of left shoulder   Muscle weakness (generalized)   Rationale for Evaluation and Treatment Rehabilitation   ONSET DATE: Summer 2023   SUBJECTIVE:                                                                                                                                                                                       SUBJECTIVE STATEMENT: 07/14/2022 Pt last seen 11/16. She has been out of the country. She has been  doing HEP, and feels she is doing quite well. Still has some mild tension in sub occipital area at times.   Eval:States she was helping her partner cleaning pools this summer and there left UT really spasms. States she then drove and that bothered her. States that when she does something extra it bothers like doing pushups or overhead exercises. States she has tried some massages and it helps for a short while. States that the pain occasionally goes up in the neck and into  the left shoulder. States it does not bother her to golf.    PERTINENT HISTORY: hysterectomy   PAIN:  Are you having pain? Yes: NPRS scale: 0-2/10 Pain location: left UT Pain description:  tension Aggravating factors: driving Relieving factors: percussion gun and massage    PRECAUTIONS: None   WEIGHT BEARING RESTRICTIONS No   FALLS:  Has patient fallen in last 6 months? No      PLOF: Independent plays golf and curls   PATIENT GOALS to have less pain   OBJECTIVE:     COGNITION:           Overall cognitive status: Within functional limits for tasks assessed     POSTURE: Rounded shoulders, forward head, increased thoracic kyphosis              Cervical A/PROM:    04/14/2022  07/14/22    Flexion WFL  WFL    Extension  20*     R ROT 40**   70   L ROT  55**  72   R SB 20**     L SB 20**                          * Pain/pulling                       **pulling on left side (Blank rows = not tested)                UE Measurements       Upper Extremity Right 04/14/2022 Left 04/14/2022    A/PROM MMT A/PROM MMT  Shoulder Flexion 160/165 4+ 155*/160* 4+  Shoulder Extension          Shoulder Abduction 170* 4 170* 4  Shoulder Adduction          Shoulder Internal Rotation Reaches to T12 SP/60 4+ Reaches to L1 SP*/45 4+  Shoulder External Rotation Reaches to C6 SP/90 4 Reaches to C7 SP /90 4  Elbow Flexion          Elbow Extension          Wrist Flexion          Wrist Extension          Wrist Supination           Wrist Pronation          Wrist Ulnar Deviation          Wrist Radial Deviation          Grip Strength NA   NA                          (Blank rows = not tested)                       * pulling pain                       Pt is right handed        JOINT MOBILITY TESTING:  Hypomobility noted throughout cervical and thoracic spine   PALPATION:  Tenderness to palpation along cervical paraspinals. Left RC muscles and UT             TODAY'S TREATMENT:  07/14/2022     Therapeutic Exercise:            Aerobic: Supine: Reviewed self mobilization  with 2 tennis balls at sub occipital region,  chin tuck with assist x10  5" holds, neck ROT x10 bil  5" holds, shoulder flexion/dowel x 15;  Prone:            Seated: scap squeeze x 15;  UT and levator stretches x 3 ea bil; SO self stretch, review for HEP            Standing:  Neuromuscular Re-education: Manual Therapy: STM to cervical paraspinals and suboccipitals, SOR,       PATIENT EDUCATION:  Education details: HEP, Person educated: Patient Education method: Explanation, Demonstration, and Handouts Education comprehension: verbalized understanding     HOME EXERCISE PROGRAM: [id number]   ASSESSMENT:   CLINICAL IMPRESSION:  07/14/2022 Pt doing very well at this time. She has mild soreness/tightness in sub occipital area at times. Reviewed self release and stretching for this area, as well as postural exercises today. She has much improved cervical ROM and rotation. Also reviewed low back stretches per pt request. Pt is going out of the country again for a few months. She agrees to d/c at this time, will continue HEP. Pt has met goals.    Eval: Patient presents with left upper shoulder/neck pain that started earlier this Summer. Patient demonstrates reduced ROM, strength and posture that is likely contributing to current condition. Patient educated in exam findings and POC moving forward. Patient to go out of town for 6 week  and provided HEP to work on until she returns. Patient would greatly benefit from skilled PT to improve overall function and QOL.     OBJECTIVE IMPAIRMENTS decreased activity tolerance, decreased ROM, decreased strength, impaired UE functional use, postural dysfunction, and pain.    ACTIVITY LIMITATIONS carrying, lifting, reach over head, and hygiene/grooming   PARTICIPATION LIMITATIONS: meal prep, cleaning, and driving   PERSONAL FACTORS Time since onset of injury/illness/exacerbation are also affecting patient's functional outcome.    REHAB POTENTIAL: Good   CLINICAL DECISION MAKING: Stable/uncomplicated   EVALUATION COMPLEXITY: Low     GOALS: Goals reviewed with patient?  yes   SHORT TERM GOALS:   Patient will be independent in self management strategies to improve quality of life and functional outcomes. Baseline: new program Target date: 05/26/2022 Goal status: MET 2.  Patient will report at least 50% improvement in overall symptoms and/or function to demonstrate improved functional mobility Baseline: 0% Target date: 05/26/2022 Goal status: MET   3.  Patient will be abel to demonstrate at least 60 degrees of cervical ROT while sitting to improve ability to check blind spots while driving Baseline: see above Target date: 05/26/2022 Goal status: MET           LONG TERM GOALS:   Patient will report at least 75% improvement in overall symptoms and/or function to demonstrate improved functional mobility Baseline: 0% Target date: 07/07/2022 Goal status: IMET   2.  Patient will be able to drive without pain to demonstrate improved shoulder mobility/function while driving Baseline: painful Target date: 07/07/2022 Goal status: MET   3.  Patient will be able to demonstrate painfree left shoulder ROM  Baseline: painful Target date: 07/07/2022 Goal status: MET       PLAN: PT FREQUENCY: 1-2x/week for total of 12 visits over 12 week certification (patient going out of  town for 6 weeks)   PT DURATION: 12 weeks   PLANNED INTERVENTIONS: Therapeutic exercises, Therapeutic activity, Neuromuscular re-education, Balance training, Gait training, Patient/Family education, Self Care, Joint mobilization, Joint manipulation, Orthotic/Fit training,  DME instructions, Aquatic Therapy, Dry Needling, Electrical stimulation, Spinal manipulation, Spinal mobilization, Cryotherapy, Moist heat, Taping, Traction, Ionotophoresis 35m/ml Dexamethasone, Manual therapy, and Re-evaluation   PLAN FOR NEXT SESSION: lat stretch/low back mobility., posture, DN?, shoulder ROM, thoracic motion, STM/percussion   LLyndee Hensen PT, DPT 10:24 AM  07/19/22  PHYSICAL THERAPY DISCHARGE SUMMARY  Visits from Start of Care: 4 Plan: Patient agrees to discharge.  Patient goals were  met. Patient is being discharged due to meeting the stated rehab goals.     LLyndee Hensen PT, DPT 10:24 AM  07/19/22

## 2022-07-19 ENCOUNTER — Encounter: Payer: Self-pay | Admitting: Physical Therapy

## 2022-07-20 ENCOUNTER — Ambulatory Visit (AMBULATORY_SURGERY_CENTER): Payer: 59 | Admitting: Gastroenterology

## 2022-07-20 ENCOUNTER — Encounter: Payer: Self-pay | Admitting: Gastroenterology

## 2022-07-20 VITALS — BP 119/77 | HR 77 | Temp 97.8°F | Resp 16 | Ht 68.0 in | Wt 212.0 lb

## 2022-07-20 DIAGNOSIS — K5140 Inflammatory polyps of colon without complications: Secondary | ICD-10-CM

## 2022-07-20 DIAGNOSIS — K5190 Ulcerative colitis, unspecified, without complications: Secondary | ICD-10-CM

## 2022-07-20 DIAGNOSIS — D123 Benign neoplasm of transverse colon: Secondary | ICD-10-CM

## 2022-07-20 DIAGNOSIS — K529 Noninfective gastroenteritis and colitis, unspecified: Secondary | ICD-10-CM | POA: Diagnosis not present

## 2022-07-20 DIAGNOSIS — K621 Rectal polyp: Secondary | ICD-10-CM | POA: Diagnosis not present

## 2022-07-20 DIAGNOSIS — K635 Polyp of colon: Secondary | ICD-10-CM | POA: Diagnosis not present

## 2022-07-20 MED ORDER — SODIUM CHLORIDE 0.9 % IV SOLN: 500.0000 mL | Freq: Once | INTRAVENOUS | Status: DC

## 2022-07-20 NOTE — Op Note (Signed)
[place] Patient Name: Tamara Richardson Procedure Date: 07/20/2022 4:51 PM MRN: [id number] Endoscopist: Nicki Reaper E. Candis Schatz , MD, [id number] Age: 61 Referring MD:  Date of Birth: Dec 04, 1960 Gender: Female Account #: [id number] Procedure:                Colonoscopy Indications:              High risk colon cancer surveillance: Ulcerative                            colitis; off all medications for 20 years,                            asymptomatic; details of previous disease extent                            not available Medicines:                Monitored Anesthesia Care Procedure:                Pre-Anesthesia Assessment:                           - Prior to the procedure, a History and Physical                            was performed, and patient medications and                            allergies were reviewed. The patient's tolerance of                            previous anesthesia was also reviewed. The risks                            and benefits of the procedure and the sedation                            options and risks were discussed with the patient.                            All questions were answered, and informed consent                            was obtained. Prior Anticoagulants: The patient has                            taken no anticoagulant or antiplatelet agents. ASA                            Grade Assessment: II - A patient with mild systemic                            disease. After reviewing the risks and benefits,  the patient was deemed in satisfactory condition to                            undergo the procedure.                           After obtaining informed consent, the colonoscope                            was passed under direct vision. Throughout the                            procedure, the patient's blood pressure, pulse, and                            oxygen saturations were monitored continuously.  The                            CF [id number] #[id number] was introduced through the anus                            and advanced to the the terminal ileum, with                            identification of the appendiceal orifice and IC                            valve. The colonoscopy was performed without                            difficulty. The patient tolerated the procedure                            well. The quality of the bowel preparation was                            good. The terminal ileum, ileocecal valve,                            appendiceal orifice, and rectum were photographed.                            The bowel preparation used was SUPREP via split                            dose instruction. Scope In: 4:56:34 PM Scope Out: 5:22:02 PM Scope Withdrawal Time: 0 hours 19 minutes 53 seconds  Total Procedure Duration: 0 hours 25 minutes 28 seconds  Findings:                 Hemorrhoids were found on perianal exam.                           The digital rectal exam was normal. Pertinent  negatives include normal sphincter tone and no                            palpable rectal lesions.                           A localized area of mildly inflamed mucosa was                            found in the ascending colon and in the cecum.                            Biopsies were taken with a cold forceps for                            histology. Estimated blood loss was minimal.                           The colon (entire examined portion) otherwise                            appeared normal. Biopsies were taken with a cold                            forceps for histology to assess for disease                            activity in the transverse, sigmoid and rectum.                            Estimated blood loss was minimal.                           A 13 mm polyp was found in the transverse colon.                            The polyp was flat. The polyp was  removed with a                            cold snare. Resection and retrieval were complete.                            Estimated blood loss was minimal.                           Many sessile polyps were found in the rectum. The                            polyps were 2 to 5 mm in size. Two of these polyps                            were removed with a cold snare. Resection and  retrieval were complete. Estimated blood loss was                            minimal.                           The terminal ileum appeared normal.                           The retroflexed view of the distal rectum and anal                            verge was normal and showed no anal or rectal                            abnormalities. Complications:            No immediate complications. Estimated Blood Loss:     Estimated blood loss was minimal. Impression:               - Hemorrhoids found on perianal exam.                           - Inflamed mucosa in the ascending colon and in the                            cecum. Biopsied.                           - The entire examined colon is normal. Biopsied.                           - One 13 mm polyp in the transverse colon, removed                            with a cold snare. Resected and retrieved.                           - Many 2 to 5 mm polyps in the rectum, removed with                            a cold snare. Resected and retrieved.                           - The examined portion of the ileum was normal.                           - The distal rectum and anal verge are normal on                            retroflexion view. Recommendation:           - Patient has a contact number available for                            emergencies.  The signs and symptoms of potential                            delayed complications were discussed with the                            patient. Return to normal activities tomorrow.                             Written discharge instructions were provided to the                            patient.                           - Resume previous diet.                           - Continue present medications.                           - Await pathology results.                           - Repeat colonoscopy (date not yet determined) for                            surveillance based on pathology results.                           - Follow up in GI clinic to discuss restarting UC                            medications Blen Ransome E. Candis Schatz, MD 07/20/2022 5:32:21 PM This report has been signed electronically.

## 2022-07-20 NOTE — Progress Notes (Unsigned)
Sedate, gd SR, tolerated procedure well, VSS, report to RN 

## 2022-07-20 NOTE — Progress Notes (Signed)
Called to room to assist during endoscopic procedure.  Patient ID and intended procedure confirmed with present staff. Received instructions for my participation in the procedure from the performing physician.  

## 2022-07-20 NOTE — Progress Notes (Signed)
Pt's states no medical or surgical changes since previsit or office visit. 

## 2022-07-20 NOTE — Progress Notes (Signed)
[facility] History and Physical   Primary Care Physician:  Tamara Richardson, [place]   Reason for Procedure:   Colon cancer screening, ulcerative colitis disease assessment  Plan:    Colonoscopy     HPI: Tamara Richardson is a 61 y.o. female with long standing mild ulcerative colitis, off all medications for many (20) years undergoing screening colonoscopy and disease activity assessment.  She has occasional bouts of diarrhea with urgency and occasional constipation.  No bloody stools.  She has no family history of colon cancer. Thinks only her distal colon was involved.   Past Medical History:  Diagnosis Date   Hyperlipidemia 06/09/2014   genetic, was on statin for about 6 months   Ulcerative colitis ([place]) 2001   mostly controlled with diet   Vitamin D deficiency     Past Surgical History:  Procedure Laterality Date   ABDOMINAL HYSTERECTOMY  2001   FOOT SURGERY Bilateral    TONSILLECTOMY AND ADENOIDECTOMY     WISDOM TOOTH EXTRACTION      Prior to Admission medications   Medication Sig Start Date End Date Taking? Authorizing Provider  atorvastatin (LIPITOR) 10 MG tablet Take 1 tablet (10 mg total) by mouth daily. 01/24/22  Yes Tamara Richardson, Tamara Bar, PA  Vitamin D, Ergocalciferol, (DRISDOL) 1.25 MG (50000 UNIT) CAPS capsule Take 1 capsule (50,000 Units total) by mouth every 7 (seven) days. 06/10/22   Tamara November, Tamara Richardson  albuterol (VENTOLIN HFA) 108 (90 Base) MCG/ACT inhaler Inhale 2 puffs into the lungs every 6 (six) hours as needed for wheezing or shortness of breath. Patient not taking: Reported on 06/06/2022 08/02/21   Tamara Sewer, Tamara Richardson  bisacodyl (DULCOLAX) 5 MG EC tablet Take 5 mg by mouth daily as needed for moderate constipation.    Provider, Historical, Tamara Richardson  naproxen sodium (ALEVE) 220 MG tablet Take by mouth as needed. Patient not taking: Reported on 06/06/2022    Provider, Historical, Tamara Richardson    Current Outpatient Medications  Medication Sig Dispense Refill    atorvastatin (LIPITOR) 10 MG tablet Take 1 tablet (10 mg total) by mouth daily. 90 tablet 1   Vitamin D, Ergocalciferol, (DRISDOL) 1.25 MG (50000 UNIT) CAPS capsule Take 1 capsule (50,000 Units total) by mouth every 7 (seven) days. 12 capsule 0   albuterol (VENTOLIN HFA) 108 (90 Base) MCG/ACT inhaler Inhale 2 puffs into the lungs every 6 (six) hours as needed for wheezing or shortness of breath. (Patient not taking: Reported on 06/06/2022) 8 g 0   bisacodyl (DULCOLAX) 5 MG EC tablet Take 5 mg by mouth daily as needed for moderate constipation.     naproxen sodium (ALEVE) 220 MG tablet Take by mouth as needed. (Patient not taking: Reported on 06/06/2022)     Current Facility-Administered Medications  Medication Dose Route Frequency Provider Last Rate Last Admin   0.9 %  sodium chloride infusion  500 mL Intravenous Once Tamara November, Tamara Richardson        Allergies as of 07/20/2022 - Review Complete 07/20/2022  Allergen Reaction Noted   Codeine  06/09/2014   Uncaria tomentosa (cats claw)  02/28/2017    Family History  Problem Relation Age of Onset   Heart disease Mother    Heart attack Father 76   Hypertension Father    Breast cancer Maternal Aunt 56       died at age 15/ mother's half sister   Colon cancer Neg Hx    Stomach cancer Neg Hx    Esophageal cancer Neg Hx  Social History   Socioeconomic History   Marital status: Divorced    Spouse name: Not on file   Number of children: 1   Years of education: Not on file   Highest education level: Not on file  Occupational History   Occupation: retired  Tobacco Use   Smoking status: Never   Smokeless tobacco: Never  Vaping Use   Vaping Use: Never used  Substance and Sexual Activity   Alcohol use: Yes    Alcohol/week: 1.0 standard drink of alcohol    Types: 1 Glasses of wine per week    Comment: 2 drinks a week   Drug use: Never   Sexual activity: Yes    Birth control/protection: Surgical  Other Topics Concern   Not on  file  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  Physical Exam: Vital signs BP (!) 150/95   Pulse 83   Temp 97.8 F (36.6 C)   Ht '5\' 8"'$  (1.727 m)   Wt 212 lb (96.2 kg)   SpO2 96%   BMI 32.23 kg/m   General:   Alert,  Well-developed, well-nourished, pleasant and cooperative in NAD Airway:  Mallampati 2 Lungs:  Clear throughout to auscultation.   Heart:  Regular rate and rhythm; no murmurs, clicks, rubs,  or gallops. Abdomen:  Soft, nontender and nondistended. Normal bowel sounds.   Neuro/Psych:  Normal mood and affect. A and O x 3   Tamara Richardson E. Candis Schatz, Tamara Richardson [facility]

## 2022-07-20 NOTE — Patient Instructions (Addendum)
- Patient has a contact number available for emergencies. The signs and symptoms of potential delayed complications were discussed with the patient. Return to normal activities tomorrow. Written discharge instructions were provided to the patient. - Resume previous diet. - Continue present medications. - Await pathology results. - Repeat colonoscopy (date not yet determined) for surveillance based on pathology results. - Follow up in GI clinic to discuss restarting UC medications. Call to schedule appointment  -Handout on polyps provided   YOU HAD AN ENDOSCOPIC PROCEDURE TODAY AT [facility]:   Refer to the procedure report that was given to you for any specific questions about what was found during the examination.  If the procedure report does not answer your questions, please call your gastroenterologist to clarify.  If you requested that your care partner not be given the details of your procedure findings, then the procedure report has been included in a sealed envelope for you to review at your convenience later.  YOU SHOULD EXPECT: Some feelings of bloating in the abdomen. Passage of more gas than usual.  Walking can help get rid of the air that was put into your GI tract during the procedure and reduce the bloating. If you had a lower endoscopy (such as a colonoscopy or flexible sigmoidoscopy) you may notice spotting of blood in your stool or on the toilet paper. If you underwent a bowel prep for your procedure, you may not have a normal bowel movement for a few days.  Please Note:  You might notice some irritation and congestion in your nose or some drainage.  This is from the oxygen used during your procedure.  There is no need for concern and it should clear up in a day or so.  SYMPTOMS TO REPORT IMMEDIATELY:  Following lower endoscopy (colonoscopy or flexible sigmoidoscopy):  Excessive amounts of blood in the stool  Significant tenderness or worsening of abdominal  pains  Swelling of the abdomen that is new, acute  Fever of 100F or higher  For urgent or emergent issues, a gastroenterologist can be reached at any hour by calling [phone]. Do not use MyChart messaging for urgent concerns.    DIET:  We do recommend a small meal at first, but then you may proceed to your regular diet.  Drink plenty of fluids but you should avoid alcoholic beverages for 24 hours.  ACTIVITY:  You should plan to take it easy for the rest of today and you should NOT DRIVE or use heavy machinery until tomorrow (because of the sedation medicines used during the test).    FOLLOW UP: Our staff will call the number listed on your records the next business day following your procedure.  We will call around 7:15- 8:00 am to check on you and address any questions or concerns that you may have regarding the information given to you following your procedure. If we do not reach you, we will leave a message.     If any biopsies were taken you will be contacted by phone or by letter within the next 1-3 weeks.  Please call us at [phone] if you have not heard about the biopsies in 3 weeks.    SIGNATURES/CONFIDENTIALITY: You and/or your care partner have signed paperwork which will be entered into your electronic medical record.  These signatures attest to the fact that that the information above on your After Visit Summary has been reviewed and is understood.  Full responsibility of the confidentiality of this discharge  information lies with you and/or your care-partner.

## 2022-07-21 ENCOUNTER — Telehealth: Payer: Self-pay | Admitting: *Deleted

## 2022-07-21 NOTE — Telephone Encounter (Signed)
  Follow up Call-     07/20/2022    3:52 PM  Call back number  Post procedure Call Back phone  # [phone]  Permission to leave phone message Yes     Patient questions:  Do you have a fever, pain , or abdominal swelling? No. Pain Score  0 *  Have you tolerated food without any problems? Yes.    Have you been able to return to your normal activities? Yes.    Do you have any questions about your discharge instructions: Diet   No. Medications  No. Follow up visit  No.  Do you have questions or concerns about your Care? No.  Actions: * If pain score is 4 or above: No action needed, pain <4.

## 2022-07-30 NOTE — Progress Notes (Signed)
Tamara Richardson,  The biopsies of your colon showed active colitis.  However, given the mildness of the abnormalities, and the very long duration you have been off all ulcerative colitis medications, I think it is reasonable to continue to remain off therapy for now, if you desire.  Resuming mesalamine would be preferable, but likely not absolutely necessary in this case.    The large polyp in the transverse colon was reported as a hyperplastic polyp, which is generally not considered precancerous.  However, given the location of the polyp in the right colon and the difficulty in distinguishing hyperplastic polyps from sessile serrated polyps (which are precancerous), I would consider this polyp more likely to be a sessile serrated polyp, and would recommend close endoscopic surveillance with a repeat colonoscopy in 1 year.

## 2022-08-23 ENCOUNTER — Ambulatory Visit: Payer: 59 | Admitting: Gastroenterology

## 2022-09-05 ENCOUNTER — Other Ambulatory Visit: Payer: Self-pay | Admitting: Physician Assistant

## 2022-12-26 ENCOUNTER — Other Ambulatory Visit: Payer: Self-pay | Admitting: Physician Assistant

## 2022-12-26 DIAGNOSIS — Z1231 Encounter for screening mammogram for malignant neoplasm of breast: Secondary | ICD-10-CM

## 2022-12-27 ENCOUNTER — Ambulatory Visit
Admission: RE | Admit: 2022-12-27 | Discharge: 2022-12-27 | Disposition: A | Discharge status: Home / Self Care | Payer: 59 | Source: Ambulatory Visit | Attending: Physician Assistant | Admitting: Physician Assistant

## 2022-12-27 DIAGNOSIS — Z1231 Encounter for screening mammogram for malignant neoplasm of breast: Secondary | ICD-10-CM

## 2023-02-14 ENCOUNTER — Telehealth: Payer: Self-pay | Admitting: *Deleted

## 2023-02-14 NOTE — Telephone Encounter (Signed)
I connected with Tamara Richardson on 7/23 at 1217 by telephone and verified that I am speaking with the correct person using two identifiers. According to the patient's chart they are due for physical with [facility]. Pt scheduled. There are no transportation issues at this time. Nothing further was needed at the end of our conversation.

## 2023-03-09 ENCOUNTER — Encounter (INDEPENDENT_AMBULATORY_CARE_PROVIDER_SITE_OTHER): Payer: Self-pay

## 2023-04-21 ENCOUNTER — Encounter: Payer: 59 | Admitting: Physician Assistant

## 2023-07-17 ENCOUNTER — Encounter: Payer: Self-pay | Admitting: Physician Assistant

## 2023-07-17 ENCOUNTER — Ambulatory Visit (INDEPENDENT_AMBULATORY_CARE_PROVIDER_SITE_OTHER): Payer: 59 | Admitting: Physician Assistant

## 2023-07-17 VITALS — BP 120/80 | HR 91 | Temp 97.8°F | Ht 67.5 in | Wt 206.0 lb

## 2023-07-17 DIAGNOSIS — E669 Obesity, unspecified: Secondary | ICD-10-CM

## 2023-07-17 DIAGNOSIS — T7840XA Allergy, unspecified, initial encounter: Secondary | ICD-10-CM | POA: Diagnosis not present

## 2023-07-17 DIAGNOSIS — Z23 Encounter for immunization: Secondary | ICD-10-CM | POA: Diagnosis not present

## 2023-07-17 DIAGNOSIS — E7800 Pure hypercholesterolemia, unspecified: Secondary | ICD-10-CM | POA: Diagnosis not present

## 2023-07-17 DIAGNOSIS — E559 Vitamin D deficiency, unspecified: Secondary | ICD-10-CM

## 2023-07-17 DIAGNOSIS — Z0000 Encounter for general adult medical examination without abnormal findings: Secondary | ICD-10-CM

## 2023-07-17 DIAGNOSIS — Z0001 Encounter for general adult medical examination with abnormal findings: Secondary | ICD-10-CM | POA: Diagnosis not present

## 2023-07-17 NOTE — Patient Instructions (Signed)
It was great to see you!  Please schedule repeat 1 year colonoscopy that is due starting 12/27   Take care,  [name] PA-C

## 2023-07-17 NOTE — Progress Notes (Signed)
Subjective:    Tamara Richardson. [place] is a 62 y.o. female and is here for a comprehensive physical exam.  HPI  Health Maintenance Due  Topic Date Due   Colonoscopy  07/21/2023    Acute Concerns: None  Chronic Issues: Allergies Has had three upper respiratory infection (URI)'s this year Has allergies but only takes claritin as needed -- does not take this regularly - only when symptomatic  Gets nasal congestion that can lead to ear infection  HLD Currently not on medication Would like this updated today Had calcium score in 2023 that was negative  Health Maintenance: Immunizations -- UpToDate  Colonoscopy -- Last done 07/20/22. Hemorrhoids, inflamed mucosa, and polyps found. Due 07/21/23. Mammogram -- UTD, last done 12/27/22. Results were normal.  PAP -- n/a Bone Density -- N/a Diet -- eats vegetables daily, no sugary drinks Exercise -- just started with trainer  Sleep habits -- having trouble staying asleep Mood -- no major concerns at this time  UTD with dentist? - yes UTD with eye doctor? - yes  Weight history: Wt Readings from Last 10 Encounters:  07/17/23 206 lb (93.4 kg)  07/20/22 212 lb (96.2 kg)  06/06/22 212 lb (96.2 kg)  02/02/22 203 lb 4 oz (92.2 kg)  08/02/21 190 lb 14.7 oz (86.6 kg)  09/02/20 191 lb (86.6 kg)  08/07/19 210 lb (95.3 kg)  04/16/18 203 lb (92.1 kg)  04/09/18 204 lb (92.5 kg)   Body mass index is 31.79 kg/m. No LMP recorded. Patient has had a hysterectomy.  Alcohol use:  reports current alcohol use of about 1.0 standard drink of alcohol per week.  Tobacco use:  Tobacco Use: Low Risk  (07/17/2023)   Patient History    Smoking Tobacco Use: Never    Smokeless Tobacco Use: Never    Passive Exposure: Not on file   Eligible for lung cancer screening? no     07/17/2023    2:04 PM  Depression screen PHQ 2/9  Decreased Interest 0  Down, Depressed, Hopeless 0  PHQ - 2 Score 0     Other providers/specialists: Patient Care  Team: [name], [place] as PCP - General (Physician Assistant) Threasa Beards, PsyD (Inactive) (Psychology)    PMHx, SurgHx, SocialHx, Medications, and Allergies were reviewed in the Visit Navigator and updated as appropriate.   Past Medical History:  Diagnosis Date   Hyperlipidemia 06/09/2014   genetic, was on statin for about 6 months   Ulcerative colitis ([facility]) 2001   mostly controlled with diet   Vitamin D deficiency      Past Surgical History:  Procedure Laterality Date   ABDOMINAL HYSTERECTOMY  2001   FOOT SURGERY Bilateral    TONSILLECTOMY AND ADENOIDECTOMY     WISDOM TOOTH EXTRACTION       Family History  Problem Relation Age of Onset   Heart disease Mother    Heart attack Father 6   Hypertension Father    Breast cancer Maternal Aunt 34       died at age 58/ mother's half sister   Colon cancer Neg Hx    Stomach cancer Neg Hx    Esophageal cancer Neg Hx     Social History   Tobacco Use   Smoking status: Never   Smokeless tobacco: Never  Vaping Use   Vaping status: Never Used  Substance Use Topics   Alcohol use: Yes    Alcohol/week: 1.0 standard drink of alcohol    Types: 1 Glasses of wine  per week   Drug use: Never    Review of Systems:   Review of Systems  Constitutional:  Negative for chills, fever, malaise/fatigue and weight loss.  HENT:  Negative for hearing loss, sinus pain and sore throat.   Respiratory:  Negative for cough and hemoptysis.   Cardiovascular:  Negative for chest pain, palpitations, leg swelling and PND.  Gastrointestinal:  Negative for abdominal pain, constipation, diarrhea, heartburn, nausea and vomiting.  Genitourinary:  Negative for dysuria, frequency and urgency.  Musculoskeletal:  Negative for back pain, myalgias and neck pain.  Skin:  Negative for itching and rash.  Neurological:  Negative for dizziness, tingling, seizures and headaches.  Endo/Heme/Allergies:  Negative for polydipsia.  Psychiatric/Behavioral:   Negative for depression. The patient is not nervous/anxious.     Objective:   BP 120/80 (BP Location: Left Arm, Patient Position: Sitting, Cuff Size: Large)   Pulse 91   Temp 97.8 F (36.6 C) (Temporal)   Ht 5' 7.5" (1.715 m)   Wt 206 lb (93.4 kg)   SpO2 98%   BMI 31.79 kg/m  Body mass index is 31.79 kg/m.   General Appearance:    Alert, cooperative, no distress, appears stated age  Head:    Normocephalic, without obvious abnormality, atraumatic  Eyes:    PERRL, conjunctiva/corneas clear, EOM's intact, fundi    benign, both eyes  Ears:    Normal TM's and external ear canals, both ears  Nose:   Nares normal, septum midline, mucosa normal, no drainage    or sinus tenderness  Throat:   Lips, mucosa, and tongue normal; teeth and gums normal  Neck:   Supple, symmetrical, trachea midline, no adenopathy;    thyroid:  no enlargement/tenderness/nodules; no carotid   bruit or JVD  Back:     Symmetric, no curvature, ROM normal, no CVA tenderness  Lungs:     Clear to auscultation bilaterally, respirations unlabored  Chest Wall:    No tenderness or deformity   Heart:    Regular rate and rhythm, S1 and S2 normal, no murmur, rub or gallop  Breast Exam:    Deferred  Abdomen:     Soft, non-tender, bowel sounds active all four quadrants,    no masses, no organomegaly  Genitalia:    Deferred  Extremities:   Extremities normal, atraumatic, no cyanosis or edema  Pulses:   2+ and symmetric all extremities  Skin:   Skin color, texture, turgor normal, no rashes or lesions  Lymph nodes:   Cervical, supraclavicular, and axillary nodes normal  Neurologic:   CNII-XII intact, normal strength, sensation and reflexes    throughout    Assessment/Plan:   Routine physical examination Today patient counseled on age appropriate routine health concerns for screening and prevention, each reviewed and up to date or declined. Immunizations reviewed and up to date or declined. Labs ordered and reviewed. Risk  factors for depression reviewed and negative. Hearing function and visual acuity are intact. ADLs screened and addressed as needed. Functional ability and level of safety reviewed and appropriate. Education, counseling and referrals performed based on assessed risks today. Patient provided with a copy of personalized plan for preventive services.  Allergy, initial encounter Recommend more consistent use of daily antihistamine Consider adding back Singulair which she used to take  Vitamin D deficiency Update vitamin D and provide recommendations  Pure hypercholesterolemia Update lipid panel and provide recommendations Has had calcium score within the past 5 years which was 0  Need for immunization against influenza  Updated today  Obesity, unspecified class, unspecified obesity type, unspecified whether serious comorbidity present Continue efforts at healthy lifestyle    I, Isabelle Course, acting as a Neurosurgeon for [name], [place]., have documented all relevant documentation on the behalf of [name], [place], as directed by  [name], PA while in the presence of [name], [place].  I, [name], [place], have reviewed all documentation for this visit. The documentation on 07/17/23 for the exam, diagnosis, procedures, and orders are all accurate and complete.   [name], PA-C [facility]

## 2023-07-18 LAB — CBC WITH DIFFERENTIAL/PLATELET
Basophils Absolute: 0.1 10*3/uL (ref 0.0–0.1)
Basophils Relative: 1.2 % (ref 0.0–3.0)
Eosinophils Absolute: 0.2 10*3/uL (ref 0.0–0.7)
Eosinophils Relative: 2.5 % (ref 0.0–5.0)
HCT: 42.6 % (ref 36.0–46.0)
Hemoglobin: 14.3 g/dL (ref 12.0–15.0)
Lymphocytes Relative: 27.6 % (ref 12.0–46.0)
Lymphs Abs: 1.7 10*3/uL (ref 0.7–4.0)
MCHC: 33.6 g/dL (ref 30.0–36.0)
MCV: 92 fL (ref 78.0–100.0)
Monocytes Absolute: 0.4 10*3/uL (ref 0.1–1.0)
Monocytes Relative: 5.9 % (ref 3.0–12.0)
Neutro Abs: 3.9 10*3/uL (ref 1.4–7.7)
Neutrophils Relative %: 62.8 % (ref 43.0–77.0)
Platelets: 262 10*3/uL (ref 150.0–400.0)
RBC: 4.63 Mil/uL (ref 3.87–5.11)
RDW: 13 % (ref 11.5–15.5)
WBC: 6.2 10*3/uL (ref 4.0–10.5)

## 2023-07-18 LAB — LIPID PANEL
Cholesterol: 213 mg/dL — ABNORMAL HIGH (ref 0–200)
HDL: 43.5 mg/dL (ref >39.00)
LDL Cholesterol: 130 mg/dL — ABNORMAL HIGH (ref 0–99)
NonHDL: 169.49
Total CHOL/HDL Ratio: 5
Triglycerides: 198 mg/dL — ABNORMAL HIGH (ref 0.0–149.0)
VLDL: 39.6 mg/dL (ref 0.0–40.0)

## 2023-07-18 LAB — COMPREHENSIVE METABOLIC PANEL
ALT: 20 U/L (ref 0–35)
AST: 20 U/L (ref 0–37)
Albumin: 4.5 g/dL (ref 3.5–5.2)
Alkaline Phosphatase: 68 U/L (ref 39–117)
BUN: 13 mg/dL (ref 6–23)
CO2: 28 meq/L (ref 19–32)
Calcium: 9.2 mg/dL (ref 8.4–10.5)
Chloride: 105 meq/L (ref 96–112)
Creatinine, Ser: 0.85 mg/dL (ref 0.40–1.20)
GFR: 72.59 mL/min (ref >60.00)
Glucose, Bld: 88 mg/dL (ref 70–99)
Potassium: 4.6 meq/L (ref 3.5–5.1)
Sodium: 141 meq/L (ref 135–145)
Total Bilirubin: 1.3 mg/dL — ABNORMAL HIGH (ref 0.2–1.2)
Total Protein: 7 g/dL (ref 6.0–8.3)

## 2023-07-18 LAB — VITAMIN D 25 HYDROXY (VIT D DEFICIENCY, FRACTURES): VITD: 14.46 ng/mL — ABNORMAL LOW (ref 30.00–100.00)

## 2023-07-20 ENCOUNTER — Other Ambulatory Visit: Payer: Self-pay | Admitting: Physician Assistant

## 2023-07-20 MED ORDER — VITAMIN D (ERGOCALCIFEROL) 1.25 MG (50000 UNIT) PO CAPS: 50000.0000 [IU] | ORAL_CAPSULE | ORAL | 0 refills | Status: AC

## 2023-09-11 ENCOUNTER — Encounter: Payer: Self-pay | Admitting: Gastroenterology

## 2023-10-02 ENCOUNTER — Encounter: Payer: Self-pay | Admitting: Physician Assistant

## 2023-10-02 DIAGNOSIS — Q828 Other specified congenital malformations of skin: Secondary | ICD-10-CM

## 2023-11-23 ENCOUNTER — Other Ambulatory Visit: Payer: Self-pay | Admitting: Physician Assistant

## 2023-11-23 DIAGNOSIS — Z1231 Encounter for screening mammogram for malignant neoplasm of breast: Secondary | ICD-10-CM

## 2023-12-26 ENCOUNTER — Ambulatory Visit: Admitting: Dermatology

## 2023-12-26 ENCOUNTER — Encounter: Payer: Self-pay | Admitting: Dermatology

## 2023-12-26 VITALS — BP 132/76 | HR 94

## 2023-12-26 DIAGNOSIS — L578 Other skin changes due to chronic exposure to nonionizing radiation: Secondary | ICD-10-CM

## 2023-12-26 DIAGNOSIS — L570 Actinic keratosis: Secondary | ICD-10-CM | POA: Diagnosis not present

## 2023-12-26 DIAGNOSIS — L821 Other seborrheic keratosis: Secondary | ICD-10-CM

## 2023-12-26 DIAGNOSIS — Q828 Other specified congenital malformations of skin: Secondary | ICD-10-CM

## 2023-12-26 DIAGNOSIS — D229 Melanocytic nevi, unspecified: Secondary | ICD-10-CM

## 2023-12-26 DIAGNOSIS — L820 Inflamed seborrheic keratosis: Secondary | ICD-10-CM | POA: Diagnosis not present

## 2023-12-26 DIAGNOSIS — Z1283 Encounter for screening for malignant neoplasm of skin: Secondary | ICD-10-CM | POA: Diagnosis not present

## 2023-12-26 DIAGNOSIS — L565 Disseminated superficial actinic porokeratosis (DSAP): Secondary | ICD-10-CM | POA: Diagnosis not present

## 2023-12-26 DIAGNOSIS — D1801 Hemangioma of skin and subcutaneous tissue: Secondary | ICD-10-CM

## 2023-12-26 DIAGNOSIS — L814 Other melanin hyperpigmentation: Secondary | ICD-10-CM

## 2023-12-26 DIAGNOSIS — W908XXA Exposure to other nonionizing radiation, initial encounter: Secondary | ICD-10-CM

## 2023-12-26 DIAGNOSIS — X32XXXA Exposure to sunlight, initial encounter: Secondary | ICD-10-CM

## 2023-12-26 NOTE — Patient Instructions (Addendum)

## 2023-12-26 NOTE — Progress Notes (Signed)
 New Patient Visit   Subjective  Tamara Richardson is a 63 y.o. female who presents for the following: Skin Cancer Screening and Full Body Skin Exam. No hx of skin cancer.  The patient presents for Total-Body Skin Exam (TBSE) for skin cancer screening and mole check. The patient has spots, moles and lesions to be evaluated, some may be new or changing.  The following portions of the chart were reviewed this encounter and updated as appropriate: medications, allergies, medical history  Review of Systems:  No other skin or systemic complaints except as noted in HPI or Assessment and Plan.  Objective  Well appearing patient in no apparent distress; mood and affect are within normal limits.  A full examination was performed including scalp, head, eyes, ears, nose, lips, neck, chest, axillae, abdomen, back, buttocks, bilateral upper extremities, bilateral lower extremities, hands, feet, fingers, toes, fingernails, and toenails. All findings within normal limits unless otherwise noted below.   Relevant physical exam findings are noted in the Assessment and Plan.  Right Upper Arm - Anterior Erythematous thin papules/macules with gritty scale.  Left Lower Leg - Anterior, Right Upper Back Inflamed stuck on papule  Assessment & Plan   SKIN CANCER SCREENING PERFORMED TODAY.  ACTINIC DAMAGE - Chronic condition, secondary to cumulative UV/sun exposure - diffuse scaly erythematous macules with underlying dyspigmentation - Recommend daily broad spectrum sunscreen SPF 30+ to sun-exposed areas, reapply every 2 hours as needed.  - Staying in the shade or wearing long sleeves, sun glasses (UVA+UVB protection) and wide brim hats (4-inch brim around the entire circumference of the hat) are also recommended for sun protection.  - Call for new or changing lesions.  LENTIGINES, SEBORRHEIC KERATOSES, HEMANGIOMAS - Benign normal skin lesions - Benign-appearing - Call for any changes  MELANOCYTIC  NEVI - Tan-brown and/or pink-flesh-colored symmetric macules and papules - Benign appearing on exam today - Observation - Call clinic for new or changing moles - Recommend daily use of broad spectrum spf 30+ sunscreen to sun-exposed areas.   Disseminated Superficial Actinic Porokeratosis Patient was counseled on the diagnosis of disseminated superficial actinic porokeratosis (DSAP), a chronic, sun-induced skin condition characterized by multiple small, annular, scaly lesions typically appearing on sun-exposed areas such as the forearms and lower legs. The condition is benign but may be cosmetically concerning and occasionally itchy. It tends to run in families and is exacerbated by cumulative sun exposure. Patient was advised on strict photoprotection including daily use of broad-spectrum sunscreen (SPF 50+), protective clothing, and avoidance of peak sun hours. While there is no definitive cure, treatment options discussed include topical 5-fluorouracil, imiquimod, or retinoids or topical statin cream, as well as cryotherapy or laser therapy for symptomatic or cosmetically bothersome lesions. Patient was also counseled on the very small but possible risk of malignant transformation in longstanding lesions and advised to monitor for changes such as rapid growth, ulceration, or bleeding, and to report these promptly. Follow-up was recommended to reassess treatment response and monitor for any suspicious changes.  POROKERATOSIS (14) Left Forearm - Anterior, Left Forearm - Posterior, Left Lower Leg - Anterior, Left Lower Leg - Posterior, Left Thigh - Anterior, Left Upper Arm - Anterior, Left Upper Arm - Posterior, Right Forearm - Anterior, Right Forearm - Posterior, Right Lower Leg - Anterior, Right Lower Leg - Posterior, Right Thigh - Anterior, Right Upper Arm - Anterior, Right Upper Arm - Posterior AK (ACTINIC KERATOSIS) Right Upper Arm - Anterior Destruction of lesion - Right Upper Arm -  Anterior Complexity: simple   Destruction method: cryotherapy   Informed consent: discussed and consent obtained   Timeout:  patient name, date of birth, surgical site, and procedure verified Lesion destroyed using liquid nitrogen: Yes   Region frozen until ice ball extended beyond lesion: Yes   Cryotherapy cycles:  2 Outcome: patient tolerated procedure well with no complications   Post-procedure details: wound care instructions given   INFLAMED SEBORRHEIC KERATOSIS (2) Left Lower Leg - Anterior, Right Upper Back Destruction of lesion - Left Lower Leg - Anterior, Right Upper Back Complexity: simple   Destruction method: cryotherapy   Informed consent: discussed and consent obtained   Timeout:  patient name, date of birth, surgical site, and procedure verified Lesion destroyed using liquid nitrogen: Yes   Region frozen until ice ball extended beyond lesion: Yes   Cryotherapy cycles:  2 Outcome: patient tolerated procedure well with no complications   Post-procedure details: sterile dressing applied and wound care instructions given   Dressing type: bandage   DSAP (DISSEMINATED SUPERFICIAL ACTINIC POROKERATOSIS)   MULTIPLE BENIGN NEVI   LENTIGINES   SEBORRHEIC KERATOSES   CHERRY ANGIOMA   ACTINIC SKIN DAMAGE    Return in about 1 year (around 12/25/2024) for TBSC.  I, Haig Levan, Surg Tech III, am acting as scribe for Deneise Finlay, MD.   Documentation: I have reviewed the above documentation for accuracy and completeness, and I agree with the above.  Deneise Finlay, MD

## 2024-01-01 ENCOUNTER — Ambulatory Visit
Admission: RE | Admit: 2024-01-01 | Discharge: 2024-01-01 | Disposition: A | Discharge status: Home / Self Care | Source: Ambulatory Visit | Attending: Physician Assistant | Admitting: Physician Assistant

## 2024-01-01 DIAGNOSIS — Z1231 Encounter for screening mammogram for malignant neoplasm of breast: Secondary | ICD-10-CM
